# Patient Record
Sex: Female | Born: 1980 | Hispanic: No | Marital: Married | Smoking: Never smoker
Health system: Southern US, Community
[De-identification: ages and names within clinical notes are randomized; demographics above are authoritative.]

## PROBLEM LIST (undated history)

## (undated) ENCOUNTER — Inpatient Hospital Stay (HOSPITAL_COMMUNITY): Payer: Self-pay

## (undated) DIAGNOSIS — E039 Hypothyroidism, unspecified: Secondary | ICD-10-CM

## (undated) DIAGNOSIS — T148XXA Other injury of unspecified body region, initial encounter: Secondary | ICD-10-CM

## (undated) HISTORY — PX: NO PAST SURGERIES: SHX2092

---

## 2010-09-21 NOTE — L&D Delivery Note (Signed)
Patient was C/C/+3 and pushed for 60 minutes with epidural.   NSVD  female infant, Apgars 9,9, weight P.   The patient had no lacerations. Fundus was firm. EBL was expected. Placenta was delivered intact. Vagina was clear.  Baby was vigorous to bedside.  Christine Payne A

## 2011-02-18 LAB — ABO/RH: RH Type: POSITIVE

## 2011-02-18 LAB — RUBELLA ANTIBODY, IGM: Rubella: IMMUNE

## 2011-02-18 LAB — ANTIBODY SCREEN: Antibody Screen: NEGATIVE

## 2011-08-27 ENCOUNTER — Encounter (HOSPITAL_COMMUNITY): Payer: Self-pay | Admitting: *Deleted

## 2011-08-27 ENCOUNTER — Inpatient Hospital Stay (HOSPITAL_COMMUNITY): Payer: BC Managed Care – PPO | Admitting: Anesthesiology

## 2011-08-27 ENCOUNTER — Inpatient Hospital Stay (HOSPITAL_COMMUNITY)
Admission: AD | Admit: 2011-08-27 | Discharge: 2011-08-29 | DRG: 373 | Disposition: A | Discharge status: Home / Self Care | Payer: BC Managed Care – PPO | Source: Ambulatory Visit | Attending: Obstetrics and Gynecology | Admitting: Obstetrics and Gynecology

## 2011-08-27 ENCOUNTER — Encounter (HOSPITAL_COMMUNITY): Payer: Self-pay | Admitting: Anesthesiology

## 2011-08-27 DIAGNOSIS — E039 Hypothyroidism, unspecified: Secondary | ICD-10-CM | POA: Diagnosis present

## 2011-08-27 DIAGNOSIS — O99284 Endocrine, nutritional and metabolic diseases complicating childbirth: Principal | ICD-10-CM | POA: Diagnosis present

## 2011-08-27 DIAGNOSIS — E079 Disorder of thyroid, unspecified: Principal | ICD-10-CM | POA: Diagnosis present

## 2011-08-27 HISTORY — DX: Hypothyroidism, unspecified: E03.9

## 2011-08-27 LAB — CBC
MCH: 24 pg — ABNORMAL LOW (ref 26.0–34.0)
MCHC: 32.2 g/dL (ref 30.0–36.0)
MCV: 74.4 fL — ABNORMAL LOW (ref 78.0–100.0)
Platelets: 290 10*3/uL (ref 150–400)
RBC: 4.38 MIL/uL (ref 3.87–5.11)

## 2011-08-27 MED ORDER — MEASLES, MUMPS & RUBELLA VAC ~~LOC~~ INJ
0.5000 mL | INJECTION | Freq: Once | SUBCUTANEOUS | Status: DC
  Filled 2011-08-27: qty 0.5

## 2011-08-27 MED ORDER — FENTANYL 2.5 MCG/ML BUPIVACAINE 1/10 % EPIDURAL INFUSION (WH - ANES)
14.0000 mL/h | INTRAMUSCULAR | Status: DC
  Filled 2011-08-27: qty 60

## 2011-08-27 MED ORDER — PRENATAL PLUS 27-1 MG PO TABS
1.0000 | ORAL_TABLET | Freq: Every day | ORAL | Status: DC
  Administered 2011-08-28: 1 via ORAL
  Filled 2011-08-27: qty 1

## 2011-08-27 MED ORDER — DIPHENHYDRAMINE HCL 25 MG PO CAPS: 25.0000 mg | ORAL_CAPSULE | Freq: Four times a day (QID) | ORAL | Status: DC | PRN

## 2011-08-27 MED ORDER — OXYCODONE-ACETAMINOPHEN 5-325 MG PO TABS: 2.0000 | ORAL_TABLET | ORAL | Status: DC | PRN

## 2011-08-27 MED ORDER — BUTORPHANOL TARTRATE 2 MG/ML IJ SOLN: 1.0000 mg | INTRAMUSCULAR | Status: DC | PRN

## 2011-08-27 MED ORDER — FLEET ENEMA 7-19 GM/118ML RE ENEM: 1.0000 | ENEMA | RECTAL | Status: DC | PRN

## 2011-08-27 MED ORDER — OXYTOCIN BOLUS FROM INFUSION
500.0000 mL | Freq: Once | INTRAVENOUS | Status: AC
  Administered 2011-08-27: 500 mL via INTRAVENOUS
  Filled 2011-08-27: qty 1000
  Filled 2011-08-27: qty 500

## 2011-08-27 MED ORDER — SODIUM CHLORIDE 0.9 % IJ SOLN: 3.0000 mL | INTRAMUSCULAR | Status: DC | PRN

## 2011-08-27 MED ORDER — ONDANSETRON HCL 4 MG/2ML IJ SOLN: 4.0000 mg | INTRAMUSCULAR | Status: DC | PRN

## 2011-08-27 MED ORDER — BENZOCAINE-MENTHOL 20-0.5 % EX AERO
INHALATION_SPRAY | CUTANEOUS | Status: AC
  Administered 2011-08-27: 13:00:00
  Filled 2011-08-27: qty 56

## 2011-08-27 MED ORDER — SIMETHICONE 80 MG PO CHEW: 80.0000 mg | CHEWABLE_TABLET | ORAL | Status: DC | PRN

## 2011-08-27 MED ORDER — IBUPROFEN 800 MG PO TABS
800.0000 mg | ORAL_TABLET | Freq: Three times a day (TID) | ORAL | Status: DC
  Administered 2011-08-27 – 2011-08-29 (×7): 800 mg via ORAL
  Filled 2011-08-27 (×9): qty 1

## 2011-08-27 MED ORDER — SODIUM CHLORIDE 0.9 % IV SOLN: 250.0000 mL | INTRAVENOUS | Status: DC | PRN

## 2011-08-27 MED ORDER — OXYCODONE-ACETAMINOPHEN 5-325 MG PO TABS: 1.0000 | ORAL_TABLET | ORAL | Status: DC | PRN

## 2011-08-27 MED ORDER — ACETAMINOPHEN 325 MG PO TABS: 650.0000 mg | ORAL_TABLET | ORAL | Status: DC | PRN

## 2011-08-27 MED ORDER — MAGNESIUM HYDROXIDE 400 MG/5ML PO SUSP: 30.0000 mL | ORAL | Status: DC | PRN

## 2011-08-27 MED ORDER — LIDOCAINE HCL 1.5 % IJ SOLN
INTRAMUSCULAR | Status: DC | PRN
  Administered 2011-08-27: 3 mL via INTRADERMAL
  Administered 2011-08-27: 4 mL via EPIDURAL
  Administered 2011-08-27: 3 mL via EPIDURAL

## 2011-08-27 MED ORDER — SENNOSIDES-DOCUSATE SODIUM 8.6-50 MG PO TABS
2.0000 | ORAL_TABLET | Freq: Every day | ORAL | Status: DC
  Administered 2011-08-27 – 2011-08-28 (×2): 2 via ORAL

## 2011-08-27 MED ORDER — EPHEDRINE 5 MG/ML INJ: 10.0000 mg | INTRAVENOUS | Status: DC | PRN

## 2011-08-27 MED ORDER — LACTATED RINGERS IV SOLN: 500.0000 mL | INTRAVENOUS | Status: DC | PRN

## 2011-08-27 MED ORDER — CITRIC ACID-SODIUM CITRATE 334-500 MG/5ML PO SOLN: 30.0000 mL | ORAL | Status: DC | PRN

## 2011-08-27 MED ORDER — SODIUM CHLORIDE 0.9 % IJ SOLN: 3.0000 mL | Freq: Two times a day (BID) | INTRAMUSCULAR | Status: DC

## 2011-08-27 MED ORDER — FERROUS SULFATE 325 (65 FE) MG PO TABS
325.0000 mg | ORAL_TABLET | Freq: Two times a day (BID) | ORAL | Status: DC
  Administered 2011-08-28 – 2011-08-29 (×3): 325 mg via ORAL
  Filled 2011-08-27 (×3): qty 1

## 2011-08-27 MED ORDER — DIPHENHYDRAMINE HCL 50 MG/ML IJ SOLN: 12.5000 mg | INTRAMUSCULAR | Status: DC | PRN

## 2011-08-27 MED ORDER — OXYTOCIN 20 UNITS IN LACTATED RINGERS INFUSION - SIMPLE
125.0000 mL/h | Freq: Once | INTRAVENOUS | Status: AC
  Administered 2011-08-27: 125 mL/h via INTRAVENOUS

## 2011-08-27 MED ORDER — ONDANSETRON HCL 4 MG PO TABS: 4.0000 mg | ORAL_TABLET | ORAL | Status: DC | PRN

## 2011-08-27 MED ORDER — WITCH HAZEL-GLYCERIN EX PADS
1.0000 "application " | MEDICATED_PAD | CUTANEOUS | Status: DC | PRN
  Administered 2011-08-27: 1 via TOPICAL

## 2011-08-27 MED ORDER — NALBUPHINE SYRINGE 5 MG/0.5 ML: 5.0000 mg | INJECTION | INTRAMUSCULAR | Status: DC | PRN

## 2011-08-27 MED ORDER — TETANUS-DIPHTH-ACELL PERTUSSIS 5-2.5-18.5 LF-MCG/0.5 IM SUSP: 0.5000 mL | Freq: Once | INTRAMUSCULAR | Status: DC

## 2011-08-27 MED ORDER — PHENYLEPHRINE 40 MCG/ML (10ML) SYRINGE FOR IV PUSH (FOR BLOOD PRESSURE SUPPORT): 80.0000 ug | PREFILLED_SYRINGE | INTRAVENOUS | Status: DC | PRN

## 2011-08-27 MED ORDER — LIDOCAINE HCL (PF) 1 % IJ SOLN
30.0000 mL | INTRAMUSCULAR | Status: DC | PRN
  Filled 2011-08-27: qty 30

## 2011-08-27 MED ORDER — LACTATED RINGERS IV SOLN
500.0000 mL | Freq: Once | INTRAVENOUS | Status: AC
  Administered 2011-08-27: 09:00:00 via INTRAVENOUS

## 2011-08-27 MED ORDER — LACTATED RINGERS IV SOLN
INTRAVENOUS | Status: DC
  Administered 2011-08-27: 08:00:00 via INTRAVENOUS

## 2011-08-27 MED ORDER — IBUPROFEN 600 MG PO TABS: 600.0000 mg | ORAL_TABLET | Freq: Four times a day (QID) | ORAL | Status: DC | PRN

## 2011-08-27 MED ORDER — OXYTOCIN 20 UNITS IN LACTATED RINGERS INFUSION - SIMPLE: 125.0000 mL/h | INTRAVENOUS | Status: DC | PRN

## 2011-08-27 MED ORDER — ONDANSETRON HCL 4 MG/2ML IJ SOLN: 4.0000 mg | Freq: Four times a day (QID) | INTRAMUSCULAR | Status: DC | PRN

## 2011-08-27 MED ORDER — LANOLIN HYDROUS EX OINT: TOPICAL_OINTMENT | CUTANEOUS | Status: DC | PRN

## 2011-08-27 MED ORDER — ZOLPIDEM TARTRATE 5 MG PO TABS: 5.0000 mg | ORAL_TABLET | Freq: Every evening | ORAL | Status: DC | PRN

## 2011-08-27 MED ORDER — METHYLERGONOVINE MALEATE 0.2 MG/ML IJ SOLN: 0.2000 mg | INTRAMUSCULAR | Status: DC | PRN

## 2011-08-27 MED ORDER — BENZOCAINE-MENTHOL 20-0.5 % EX AERO: 1.0000 "application " | INHALATION_SPRAY | CUTANEOUS | Status: DC | PRN

## 2011-08-27 MED ORDER — DIBUCAINE 1 % RE OINT: 1.0000 "application " | TOPICAL_OINTMENT | RECTAL | Status: DC | PRN

## 2011-08-27 MED ORDER — METHYLERGONOVINE MALEATE 0.2 MG PO TABS: 0.2000 mg | ORAL_TABLET | ORAL | Status: DC | PRN

## 2011-08-27 NOTE — Anesthesia Postprocedure Evaluation (Signed)
  Anesthesia Post-op Note  Patient: Christine Payne  Procedure(s) Performed: * No procedures listed *  Patient Location: 143  Anesthesia Type: Epidural  Level of Consciousness: awake, alert  and oriented  Airway and Oxygen Therapy: Patient Spontanous Breathing  Post-op Pain: mild  Post-op Assessment: Post-op Vital signs reviewed, Patient's Cardiovascular Status Stable, No headache, No backache, No residual numbness and No residual motor weakness  Post-op Vital Signs: Reviewed and stable  Complications: No apparent anesthesia complications

## 2011-08-27 NOTE — Anesthesia Preprocedure Evaluation (Addendum)
Anesthesia Evaluation  Patient identified by MRN, date of birth, ID band Patient awake    Reviewed: Allergy & Precautions, H&P , Patient's Chart, lab work & pertinent test results  Airway Mallampati: III TM Distance: >3 FB Neck ROM: full    Dental No notable dental hx. (+) Teeth Intact   Pulmonary neg pulmonary ROS,  clear to auscultation  Pulmonary exam normal       Cardiovascular neg cardio ROS regular Normal    Neuro/Psych Negative Neurological ROS  Negative Psych ROS   GI/Hepatic negative GI ROS, Neg liver ROS,   Endo/Other  Negative Endocrine ROS  Renal/GU negative Renal ROS  Genitourinary negative   Musculoskeletal   Abdominal Normal abdominal exam  (+)   Peds  Hematology negative hematology ROS (+)   Anesthesia Other Findings   Reproductive/Obstetrics (+) Pregnancy                           Anesthesia Physical Anesthesia Plan  ASA: II  Anesthesia Plan: Epidural   Post-op Pain Management:    Induction:   Airway Management Planned:   Additional Equipment:   Intra-op Plan:   Post-operative Plan:   Informed Consent: I have reviewed the patients History and Physical, chart, labs and discussed the procedure including the risks, benefits and alternatives for the proposed anesthesia with the patient or authorized representative who has indicated his/her understanding and acceptance.     Plan Discussed with: Anesthesiologist and Surgeon  Anesthesia Plan Comments:         Anesthesia Quick Evaluation  

## 2011-08-27 NOTE — H&P (Signed)
30 y.o. [redacted]w[redacted]d  G1P0 comes in c/o labor.  Otherwise has good fetal movement and no bleeding.  Past Medical History  Diagnosis Date  . Hypothyroidism    No past surgical history on file.  OB History    Grav Para Term Preterm Abortions TAB SAB Ect Mult Living   1              # Outc Date GA Lbr Len/2nd Wgt Sex Del Anes PTL Lv   1 CUR               History   Social History  . Marital Status: Married    Spouse Name: N/A    Number of Children: N/A  . Years of Education: N/A   Occupational History  . Not on file.   Social History Main Topics  . Smoking status: Not on file  . Smokeless tobacco: Not on file  . Alcohol Use:   . Drug Use:   . Sexually Active:    Other Topics Concern  . Not on file   Social History Narrative  . No narrative on file   Review of patient's allergies indicates no known allergies.   Prenatal Course:  Uncomplicated.  Hx of hypothyroid but on no meds; TSH normal during pregnancy.  Filed Vitals:   08/27/11 0828  BP: 125/70  Pulse: 66  Temp: 98.4 F (36.9 C)  Resp: 20     Lungs/Cor:  NAD Abdomen:  soft, gravid Ex:  no cords, erythema SVE:  6/C/-2 per nurse FHTs:  140s, good STV, NST R Toco:  q3   A/P   Term labor.   GBS  Neg.  Zala Degrasse A

## 2011-08-27 NOTE — Progress Notes (Signed)
UR Chart review completed.  

## 2011-08-27 NOTE — Anesthesia Procedure Notes (Addendum)
Epidural Patient location during procedure: OB Start time: 08/27/2011 9:03 AM  Staffing Anesthesiologist: Jaine Estabrooks A. Performed by: anesthesiologist   Preanesthetic Checklist Completed: patient identified, site marked, surgical consent, pre-op evaluation, timeout performed, IV checked, risks and benefits discussed and monitors and equipment checked  Epidural Patient position: right lateral decubitus Prep: site prepped and draped and DuraPrep Patient monitoring: continuous pulse ox and blood pressure Approach: midline Injection technique: LOR air  Needle:  Needle type: Tuohy  Needle gauge: 17 G Needle length: 9 cm Needle insertion depth: 4 cm Catheter type: closed end flexible Catheter size: 19 Gauge Catheter at skin depth: 9 cm Test dose: negative and 1.5% lidocaine  Assessment Events: blood not aspirated, injection not painful, no injection resistance, negative IV test and no paresthesia  Additional Notes Patient is more comfortable after epidural dosed. Please see RN's note for documentation of vital signs and FHR which are stable.

## 2011-08-28 LAB — CBC
HCT: 27 % — ABNORMAL LOW (ref 36.0–46.0)
Hemoglobin: 8.6 g/dL — ABNORMAL LOW (ref 12.0–15.0)
MCH: 23.9 pg — ABNORMAL LOW (ref 26.0–34.0)
MCHC: 31.9 g/dL (ref 30.0–36.0)
MCV: 75 fL — ABNORMAL LOW (ref 78.0–100.0)
Platelets: 223 10*3/uL (ref 150–400)
RBC: 3.6 MIL/uL — ABNORMAL LOW (ref 3.87–5.11)
RDW: 17.4 % — ABNORMAL HIGH (ref 11.5–15.5)
WBC: 15.6 10*3/uL — ABNORMAL HIGH (ref 4.0–10.5)

## 2011-08-28 NOTE — Discharge Summary (Signed)
Obstetric Discharge Summary Reason for Admission: onset of labor Prenatal Procedures: NST and ultrasound Intrapartum Procedures: spontaneous vaginal delivery Postpartum Procedures: none Complications-Operative and Postpartum: none Hemoglobin  Date Value Range Status  08/28/2011 8.6* 12.0-15.0 (g/dL) Final     DELTA CHECK NOTED     REPEATED TO VERIFY     HCT  Date Value Range Status  08/28/2011 27.0* 36.0-46.0 (%) Final    Discharge Diagnoses: Term Pregnancy-delivered  Discharge Information: Date: 08/28/2011 Activity: pelvic rest Diet: routine Medications: Ibuprofen and Vicodin Condition: stable Instructions: refer to practice specific booklet Discharge to: home Follow-up Information    Follow up with HORVATH,MICHELLE A. (Routine postpartum visit)    Contact information:   [address] [place] [address] [phone]          Newborn Data: Live born female  Birth Weight: 5 lb 15.9 oz (2719 g) APGAR: 9, 9  Home with mother.  Rynlee Lisbon H. 08/28/2011, 9:16 AM

## 2011-08-28 NOTE — Progress Notes (Signed)
Post Partum Day 1  Subjective: no complaints, up ad lib, voiding, tolerating PO and + flatus  Objective: Blood pressure 101/68, pulse 84, temperature 98.6 F (37 C), temperature source Oral, resp. rate 18, height 5\' 5"  (1.651 m), weight 73.483 kg (162 lb), SpO2 99.00%, unknown if currently breastfeeding.  Physical Exam:  General: alert, cooperative, appears stated age and no distress Lochia: appropriate Uterine Fundus: firm DVT Evaluation: No evidence of DVT seen on physical exam.   Basename 08/28/11 0520 08/27/11 0820  HGB 8.6* 10.5*  HCT 27.0* 32.6*    Assessment/Plan: Routine postpartum Uncertain about circ.  Will discuss withhusband. If desires, will do prior to d/c tomorrow   LOS: 1 day   Nyana Haren H. 08/28/2011, 9:13 AM

## 2011-08-29 MED ORDER — IBUPROFEN 600 MG PO TABS: 600.0000 mg | ORAL_TABLET | Freq: Four times a day (QID) | ORAL | Status: AC | PRN

## 2011-08-29 MED ORDER — HYDROCODONE-ACETAMINOPHEN 5-500 MG PO TABS: 1.0000 | ORAL_TABLET | ORAL | Status: AC | PRN

## 2012-10-07 ENCOUNTER — Other Ambulatory Visit: Payer: Self-pay | Admitting: Otolaryngology

## 2012-10-07 DIAGNOSIS — H9319 Tinnitus, unspecified ear: Secondary | ICD-10-CM

## 2014-07-23 ENCOUNTER — Encounter (HOSPITAL_COMMUNITY): Payer: Self-pay | Admitting: *Deleted

## 2016-05-29 ENCOUNTER — Emergency Department (HOSPITAL_COMMUNITY)
Admission: EM | Admit: 2016-05-29 | Discharge: 2016-05-29 | Disposition: A | Discharge status: Home / Self Care | Payer: Medicaid Other | Attending: Emergency Medicine | Admitting: Emergency Medicine

## 2016-05-29 ENCOUNTER — Encounter (HOSPITAL_COMMUNITY): Payer: Self-pay | Admitting: Emergency Medicine

## 2016-05-29 ENCOUNTER — Emergency Department (HOSPITAL_COMMUNITY): Payer: Medicaid Other

## 2016-05-29 DIAGNOSIS — W11XXXA Fall on and from ladder, initial encounter: Secondary | ICD-10-CM | POA: Diagnosis not present

## 2016-05-29 DIAGNOSIS — E039 Hypothyroidism, unspecified: Secondary | ICD-10-CM | POA: Insufficient documentation

## 2016-05-29 DIAGNOSIS — Y939 Activity, unspecified: Secondary | ICD-10-CM | POA: Insufficient documentation

## 2016-05-29 DIAGNOSIS — Y999 Unspecified external cause status: Secondary | ICD-10-CM | POA: Diagnosis not present

## 2016-05-29 DIAGNOSIS — S8991XA Unspecified injury of right lower leg, initial encounter: Secondary | ICD-10-CM

## 2016-05-29 DIAGNOSIS — Y929 Unspecified place or not applicable: Secondary | ICD-10-CM | POA: Insufficient documentation

## 2016-05-29 HISTORY — DX: Other injury of unspecified body region, initial encounter: T14.8XXA

## 2016-05-29 MED ORDER — NAPROXEN 500 MG PO TABS: 500.0000 mg | ORAL_TABLET | Freq: Two times a day (BID) | ORAL | 0 refills | Status: DC

## 2016-05-29 MED ORDER — IBUPROFEN 400 MG PO TABS
800.0000 mg | ORAL_TABLET | Freq: Once | ORAL | Status: AC
  Administered 2016-05-29: 800 mg via ORAL
  Filled 2016-05-29: qty 2

## 2016-05-29 NOTE — Discharge Instructions (Signed)
There were no bony abnormalities on the x-ray. Weightbearing as tolerated. Use the knee sleeve and crutches as needed for comfort. Elevate the extremity whenever possible. Use naproxen or ibuprofen to reduce pain and inflammation.

## 2016-05-29 NOTE — ED Notes (Signed)
Wheeled pt back to room from waiting room. 

## 2016-05-29 NOTE — ED Provider Notes (Signed)
[facility]-EMERGENCY DEPT Provider Note   CSN: [id number] Arrival date & time: 05/29/16  [id number]  By signing my name below, I, [name], attest that this documentation has been prepared under the direction and in the presence of non-physician practitioner, Maude LericheScott Joy PA-C. Electronically Signed: [name], Scribe. 05/29/2016. 9:35 AM.   History   Chief Complaint Chief Complaint  Patient presents with  . Knee Pain   The history is provided by the patient. No language interpreter was used.    HPI Comments:  Christine PancoastJignasha Payne is a 35 y.o. female who presents to the Emergency Department complaining of sudden-onset constant right knee pain onset yesterday. Pt reports that she was on a three step ladder when she lost her balance and fell, landing with most of her weight on her right foot, but felt her right knee twist. Her pain is worsened by walking and palpation. No alleviating factors indicated. Denies neuro deficits, head injury, LOC, neck/back pain, or any other complaints.   Past Medical History:  Diagnosis Date  . Fracture    right leg  . Hypothyroidism     There are no active problems to display for this patient.   No past surgical history on file.  OB History    Gravida Para Term Preterm AB Living   1 1 1     1    SAB TAB Ectopic Multiple Live Births           1      Home Medications    Prior to Admission medications   Medication Sig Start Date End Date Taking? Authorizing Provider  naproxen (NAPROSYN) 500 MG tablet Take 1 tablet (500 mg total) by mouth 2 (two) times daily. 05/29/16   Anselm PancoastShawn C Joy, PA-C    Family History No family history on file.  Social History Social History  Substance Use Topics  . Smoking status: Not on file  . Smokeless tobacco: Not on file  . Alcohol use Not on file     Allergies   Review of patient's allergies indicates no known allergies.   Review of Systems Review of Systems  Musculoskeletal: Positive for arthralgias and myalgias.    Skin: Negative for wound.  Neurological: Negative for weakness and numbness.     Physical Exam Updated Vital Signs BP 124/78 (BP Location: Right Arm)   Pulse 65   Temp 97.8 F (36.6 C) (Oral)   Resp 16   Ht 5\' 5"  (1.651 m)   Wt 49 kg   LMP 05/17/2016 (Exact Date)   SpO2 100%   BMI 17.98 kg/m   Physical Exam  Constitutional: She appears well-developed and well-nourished. No distress.  HENT:  Head: Normocephalic and atraumatic.  Eyes: Conjunctivae are normal.  Neck: Neck supple.  Cardiovascular: Normal rate and regular rhythm.   Pulmonary/Chest: Effort normal.  Musculoskeletal: She exhibits edema and tenderness.  Swelling and tenderness to right knee on both lateral and medial side. Pain that worsens with valgus stress. Full extension but flexion limited to 90 degrees due to pain and stiffness. Patient is weightbearing with limping gait. No laxity, deformity, or noted effusion.  Neurological: She is alert.  No sensory deficits. Strength in right knee and ankle 5/5.   Skin: Skin is warm and dry. She is not diaphoretic.  Psychiatric: She has a normal mood and affect. Her behavior is normal.  Nursing note and vitals reviewed.    ED Treatments / Results  DIAGNOSTIC STUDIES:  Oxygen Saturation is 100% on RA, normal by  my interpretation.    COORDINATION OF CARE:  9:40 AM Discussed treatment plan with pt at bedside which included pain medication and imaging and pt agreed to plan.  Labs (all labs ordered are listed, but only abnormal results are displayed) Labs Reviewed - No data to display  EKG  EKG Interpretation None       Radiology Dg Knee Complete 4 Views Right  Result Date: 05/29/2016 CLINICAL DATA:  Larey Seat down yesterday down the ladder ,having lots of pain ant and post rt knee with some swilling EXAM: RIGHT KNEE - COMPLETE 4+ VIEW COMPARISON:  None. FINDINGS: No evidence of fracture, dislocation. Small effusion in the suprapatellar bursa. No evidence of  arthropathy or other focal bone abnormality. Soft tissues are unremarkable. IMPRESSION: 1. Small effusion without bone abnormality. Electronically Signed   By: Corlis Leak M.D.   On: 05/29/2016 10:16    Procedures Procedures (including critical care time)  Medications Ordered in ED Medications  ibuprofen (ADVIL,MOTRIN) tablet 800 mg (800 mg Oral Given 05/29/16 1017)     Initial Impression / Assessment and Plan / ED Course  I have reviewed the triage vital signs and the nursing notes.  Pertinent labs & imaging results that were available during my care of the patient were reviewed by me and considered in my medical decision making (see chart for details).  Clinical Course    Patient with right knee injury that occurred yesterday. No osseous abnormality on x-ray. Knee sleeve and crutches. Orthopedic follow-up. The patient was given instructions for home care as well as return precautions. Patient voices understanding of these instructions, accepts the plan, and is comfortable with discharge.     Final Clinical Impressions(s) / ED Diagnoses   Final diagnoses:  Knee injury, right, initial encounter    New Prescriptions New Prescriptions   NAPROXEN (NAPROSYN) 500 MG TABLET    Take 1 tablet (500 mg total) by mouth 2 (two) times daily.   I personally performed the services described in this documentation, which was scribed in my presence. The recorded information has been reviewed and is accurate.    Anselm Pancoast, PA-C 05/29/16 1035    Loren Racer, MD 06/03/16 0530

## 2016-05-29 NOTE — ED Notes (Addendum)
Applied knee sleeve to pt's right knee. Pt stated that it felt comfortable. Also gave pt crutches, pt ambulated with crutches and stated it felt comfortable.

## 2016-05-29 NOTE — ED Triage Notes (Signed)
Stumbled off step stool/ladder yesterday and twisted right knee, swollen, painful to walk.  Hx of old fx in same leg. Pt has rubbed turmeric rub on knee--

## 2016-12-15 DIAGNOSIS — K644 Residual hemorrhoidal skin tags: Secondary | ICD-10-CM | POA: Diagnosis not present

## 2017-08-31 IMAGING — DX DG KNEE COMPLETE 4+V*R*
4 series · 4 of 4 positions shown · non-contrast
Comparison: None.

CLINICAL DATA: Fell down yesterday down the ladder ,having lots of
pain ant and post rt knee with some swilling

EXAM:
RIGHT KNEE - COMPLETE 4+ VIEW

[t knee ap right]
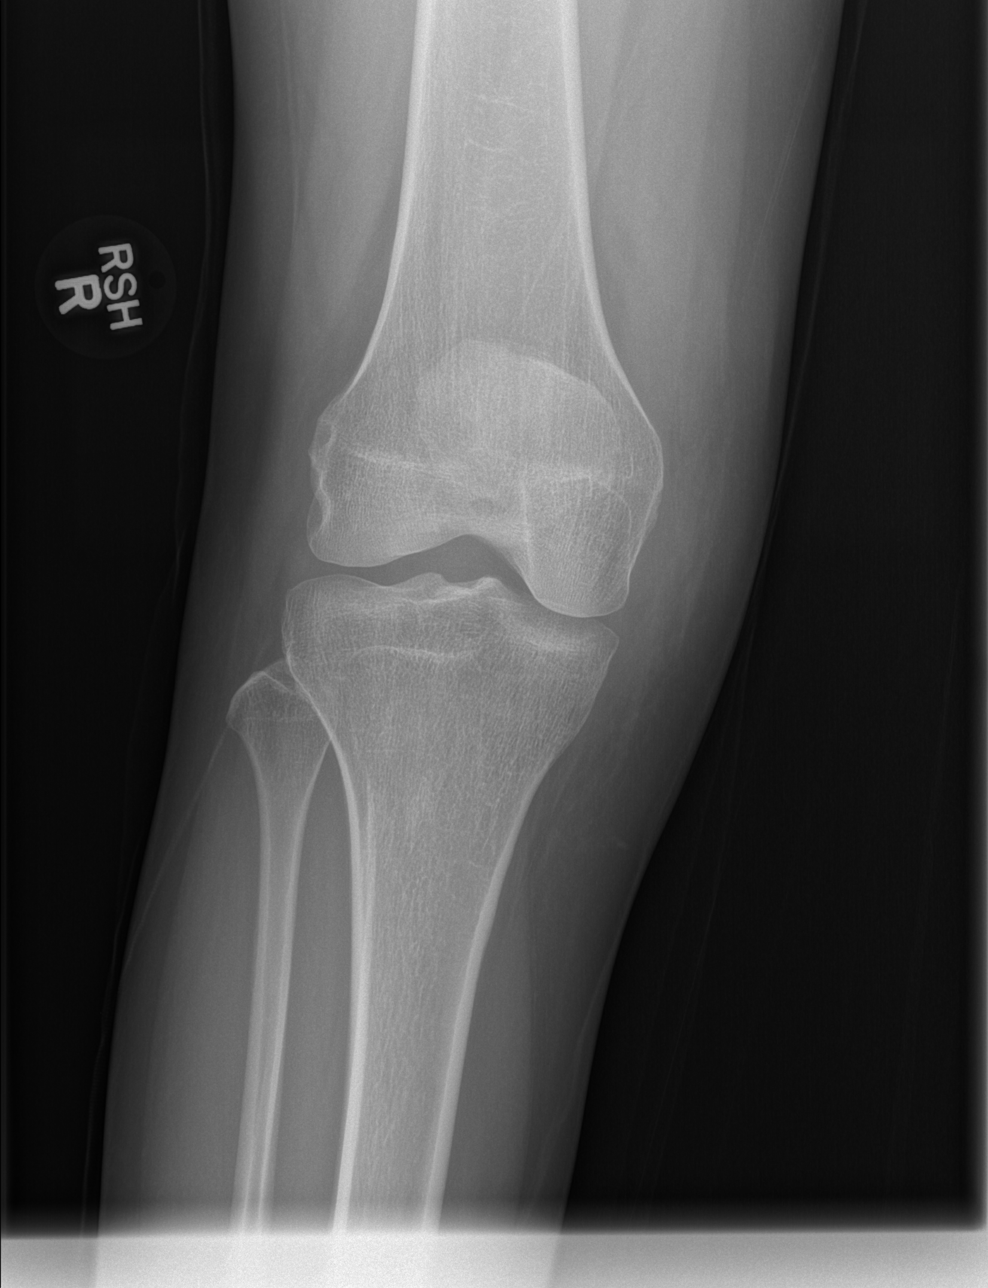

[t knee obl right]
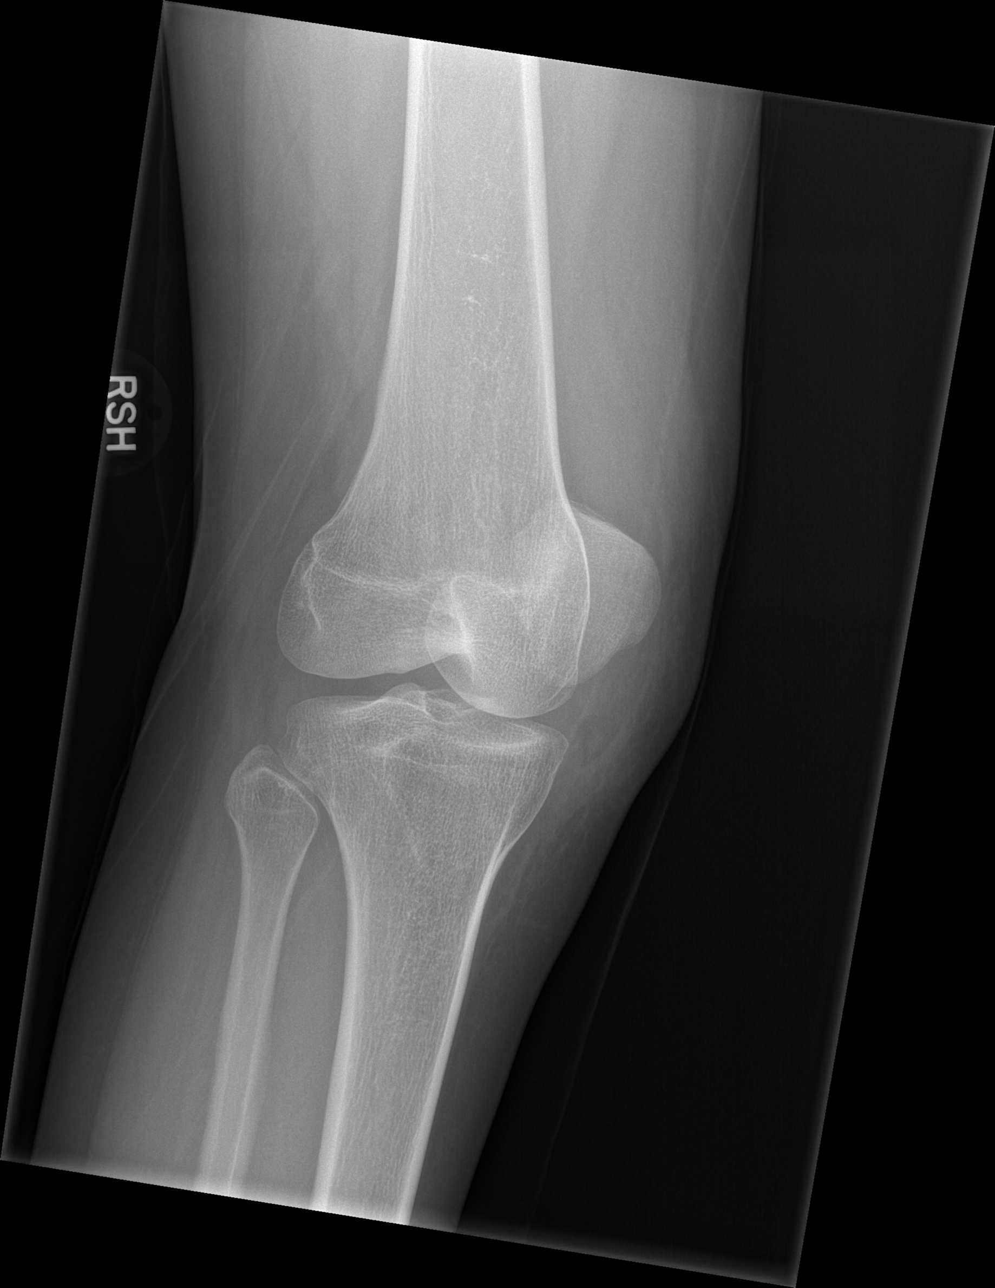

[t knee lat right (1 of 2)]
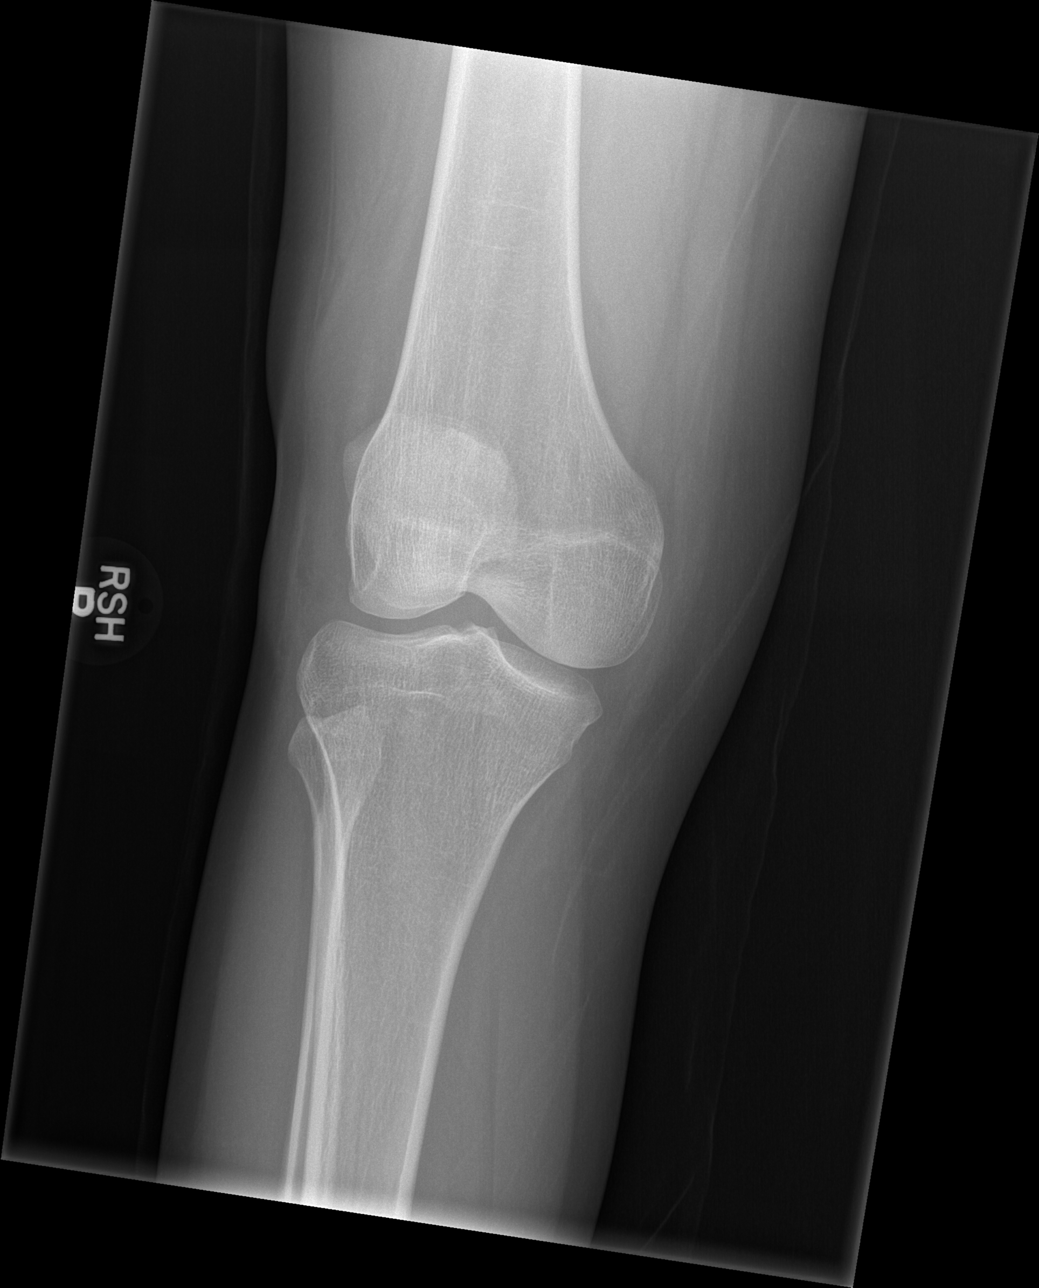

[t knee lat right (2 of 2)]
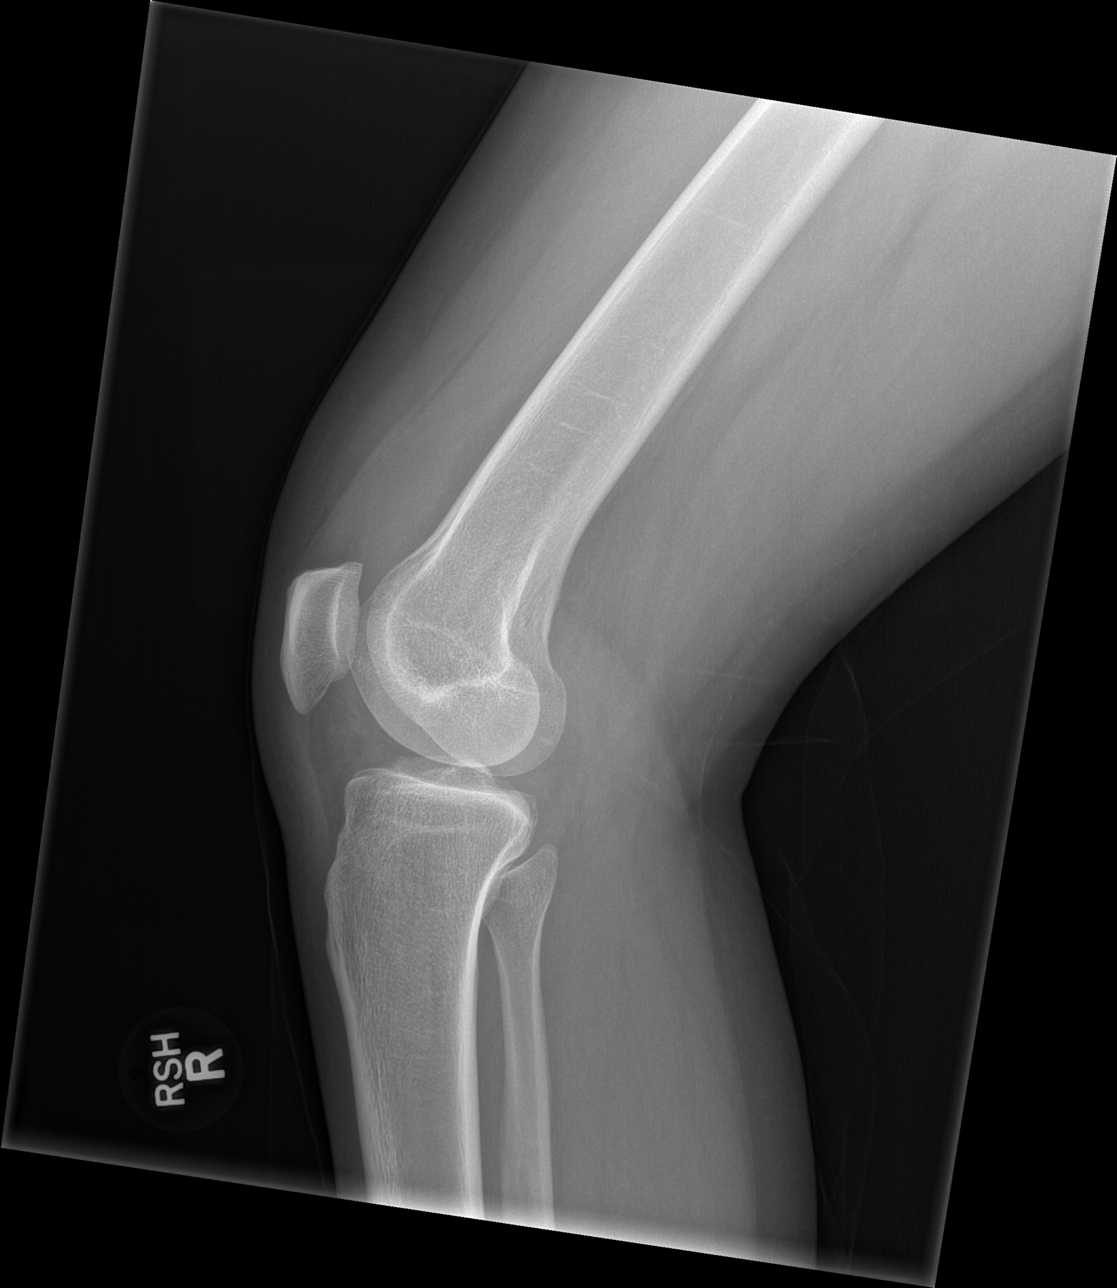

[4 of 4 positions shown; findings below may reference images not displayed]

FINDINGS: No evidence of fracture, dislocation. Small effusion in the
suprapatellar bursa. No evidence of arthropathy or other focal bone
abnormality. Soft tissues are unremarkable.
IMPRESSION: 1. Small effusion without bone abnormality.

## 2017-09-21 NOTE — L&D Delivery Note (Signed)
Patient delivered in car as arriving to hospital.  See MAU note   NSVD female infant, Apgars not noted, per staff double nuchal reduced, weight pending.   The patient had no laceration requiring repair, right periurethral abrasion. Fundus was firm. EBL once on L&D - none, see MAU note for any other delivery details. Placenta was delivered intact. Vagina was clear.  Baby was vigorous and doing skin to skin with mother.  Philip AspenALLAHAN, Christine Payne

## 2017-09-29 ENCOUNTER — Other Ambulatory Visit: Payer: Self-pay

## 2017-09-29 ENCOUNTER — Encounter (HOSPITAL_COMMUNITY): Payer: Self-pay | Admitting: *Deleted

## 2017-09-29 ENCOUNTER — Inpatient Hospital Stay (HOSPITAL_COMMUNITY)
Admission: AD | Admit: 2017-09-29 | Discharge: 2017-09-29 | Disposition: A | Discharge status: Home / Self Care | Payer: Self-pay | Source: Ambulatory Visit | Attending: Obstetrics and Gynecology | Admitting: Obstetrics and Gynecology

## 2017-09-29 DIAGNOSIS — Z3A09 9 weeks gestation of pregnancy: Secondary | ICD-10-CM | POA: Insufficient documentation

## 2017-09-29 DIAGNOSIS — O26891 Other specified pregnancy related conditions, first trimester: Secondary | ICD-10-CM | POA: Insufficient documentation

## 2017-09-29 DIAGNOSIS — E039 Hypothyroidism, unspecified: Secondary | ICD-10-CM | POA: Insufficient documentation

## 2017-09-29 DIAGNOSIS — O210 Mild hyperemesis gravidarum: Secondary | ICD-10-CM | POA: Insufficient documentation

## 2017-09-29 DIAGNOSIS — K219 Gastro-esophageal reflux disease without esophagitis: Secondary | ICD-10-CM | POA: Insufficient documentation

## 2017-09-29 DIAGNOSIS — Z791 Long term (current) use of non-steroidal anti-inflammatories (NSAID): Secondary | ICD-10-CM | POA: Insufficient documentation

## 2017-09-29 DIAGNOSIS — O99611 Diseases of the digestive system complicating pregnancy, first trimester: Secondary | ICD-10-CM | POA: Insufficient documentation

## 2017-09-29 DIAGNOSIS — R1010 Upper abdominal pain, unspecified: Secondary | ICD-10-CM | POA: Insufficient documentation

## 2017-09-29 DIAGNOSIS — E860 Dehydration: Secondary | ICD-10-CM | POA: Insufficient documentation

## 2017-09-29 DIAGNOSIS — O99281 Endocrine, nutritional and metabolic diseases complicating pregnancy, first trimester: Secondary | ICD-10-CM | POA: Insufficient documentation

## 2017-09-29 LAB — URINALYSIS, ROUTINE W REFLEX MICROSCOPIC
BILIRUBIN URINE: NEGATIVE
Glucose, UA: NEGATIVE mg/dL
Hgb urine dipstick: NEGATIVE
KETONES UR: 80 mg/dL — AB
LEUKOCYTES UA: NEGATIVE
Nitrite: NEGATIVE
Protein, ur: 30 mg/dL — AB
SPECIFIC GRAVITY, URINE: 1.029 (ref 1.005–1.030)
pH: 5 (ref 5.0–8.0)

## 2017-09-29 LAB — CBC WITH DIFFERENTIAL/PLATELET
BASOS ABS: 0 10*3/uL (ref 0.0–0.1)
Basophils Relative: 0 %
EOS PCT: 1 %
Eosinophils Absolute: 0.1 10*3/uL (ref 0.0–0.7)
HCT: 38.9 % (ref 36.0–46.0)
Hemoglobin: 13.6 g/dL (ref 12.0–15.0)
Lymphocytes Relative: 17 %
Lymphs Abs: 1.8 10*3/uL (ref 0.7–4.0)
MCH: 28.1 pg (ref 26.0–34.0)
MCHC: 35 g/dL (ref 30.0–36.0)
MCV: 80.4 fL (ref 78.0–100.0)
Monocytes Absolute: 0.2 10*3/uL (ref 0.1–1.0)
Monocytes Relative: 2 %
Neutro Abs: 8.4 10*3/uL — ABNORMAL HIGH (ref 1.7–7.7)
Neutrophils Relative %: 80 %
PLATELETS: 231 10*3/uL (ref 150–400)
RBC: 4.84 MIL/uL (ref 3.87–5.11)
RDW: 13.2 % (ref 11.5–15.5)
WBC: 10.6 10*3/uL — AB (ref 4.0–10.5)

## 2017-09-29 LAB — COMPREHENSIVE METABOLIC PANEL
ALT: 13 U/L — AB (ref 14–54)
AST: 17 U/L (ref 15–41)
Albumin: 4.4 g/dL (ref 3.5–5.0)
Alkaline Phosphatase: 60 U/L (ref 38–126)
Anion gap: 12 (ref 5–15)
BUN: 19 mg/dL (ref 6–20)
CHLORIDE: 100 mmol/L — AB (ref 101–111)
CO2: 21 mmol/L — AB (ref 22–32)
CREATININE: 0.59 mg/dL (ref 0.44–1.00)
Calcium: 9 mg/dL (ref 8.9–10.3)
GFR calc Af Amer: >60 mL/min (ref >60)
GFR calc non Af Amer: >60 mL/min (ref >60)
Glucose, Bld: 87 mg/dL (ref 65–99)
Potassium: 3.8 mmol/L (ref 3.5–5.1)
SODIUM: 133 mmol/L — AB (ref 135–145)
Total Bilirubin: 0.8 mg/dL (ref 0.3–1.2)
Total Protein: 7.6 g/dL (ref 6.5–8.1)

## 2017-09-29 LAB — POCT PREGNANCY, URINE: PREG TEST UR: POSITIVE — AB

## 2017-09-29 MED ORDER — ONDANSETRON 8 MG PO TBDP: 8.0000 mg | ORAL_TABLET | Freq: Three times a day (TID) | ORAL | 0 refills | Status: DC | PRN

## 2017-09-29 MED ORDER — PROMETHAZINE HCL 25 MG/ML IJ SOLN
25.0000 mg | Freq: Once | INTRAMUSCULAR | Status: AC
  Administered 2017-09-29: 25 mg via INTRAVENOUS
  Filled 2017-09-29: qty 1

## 2017-09-29 MED ORDER — DEXTROSE 5 % IN LACTATED RINGERS IV BOLUS
1000.0000 mL | Freq: Once | INTRAVENOUS | Status: AC
  Administered 2017-09-29: 1000 mL via INTRAVENOUS

## 2017-09-29 MED ORDER — SODIUM CHLORIDE 0.9 % IV SOLN
8.0000 mg | Freq: Once | INTRAVENOUS | Status: AC
  Administered 2017-09-29: 8 mg via INTRAVENOUS
  Filled 2017-09-29: qty 4

## 2017-09-29 MED ORDER — PROMETHAZINE HCL 25 MG RE SUPP: 25.0000 mg | Freq: Four times a day (QID) | RECTAL | 0 refills | Status: DC | PRN

## 2017-09-29 MED ORDER — RANITIDINE HCL 150 MG PO TABS: 150.0000 mg | ORAL_TABLET | Freq: Two times a day (BID) | ORAL | 1 refills | Status: DC

## 2017-09-29 MED ORDER — M.V.I. ADULT IV INJ
Freq: Once | INTRAVENOUS | Status: AC
  Administered 2017-09-29: 18:00:00 via INTRAVENOUS
  Filled 2017-09-29: qty 1000

## 2017-09-29 MED ORDER — FAMOTIDINE IN NACL 20-0.9 MG/50ML-% IV SOLN
20.0000 mg | Freq: Once | INTRAVENOUS | Status: AC
  Administered 2017-09-29: 20 mg via INTRAVENOUS
  Filled 2017-09-29: qty 50

## 2017-09-29 NOTE — Discharge Instructions (Signed)
Eating Plan for Hyperemesis Gravidarum °Hyperemesis gravidarum is a severe form of morning sickness. Because this condition causes severe nausea and vomiting, it can lead to dehydration, malnutrition, and weight loss. One way to lessen the symptoms of nausea and vomiting is to follow the eating plan for hyperemesis gravidarum. It is often used along with prescribed medicines to control your symptoms. °What can I do to relieve my symptoms? °Listen to your body. Everyone is different and has different preferences. Find what works best for you. Take any of the following actions that are helpful to you: °· Eat and drink slowly. °· Eat 5-6 small meals daily instead of 3 large meals. °· Eat crackers before you get out of bed in the morning. °· Try having a snack in the middle of the night. °· Starchy foods are usually tolerated well. Examples include cereal, toast, bread, potatoes, pasta, rice, and pretzels. °· Ginger may help with nausea. Add ¼ tsp ground ginger to hot tea or choose ginger tea. °· Try drinking 100% fruit juice or an electrolyte drink. An electrolyte drink contains sodium, potassium, and chloride. °· Continue to take your prenatal vitamins as told by your health care provider. If you are having trouble taking your prenatal vitamins, talk with your health care provider about different options. °· Include at least 1 serving of protein with your meals and snacks. Protein options include meats or poultry, beans, nuts, eggs, and yogurt. Try eating a protein-rich snack before bed. Examples of these snacks include cheese and crackers or half of a peanut butter or turkey sandwich. °· Consider eliminating foods that trigger your symptoms. These may include spicy foods, coffee, high-fat foods, very sweet foods, and acidic foods. °· Try meals that have more protein combined with bland, salty, lower-fat, and dry foods, such as nuts, seeds, pretzels, crackers, and cereal. °· Talk with your healthcare provider about  starting a supplement of vitamin B6. °· Have fluids that are cold, clear, and carbonated or sour. Examples include lemonade, ginger ale, lemon-lime soda, ice water, and sparkling water. °· Try lemon or mint tea. °· Try brushing your teeth or using a mouth rinse after meals. ° °What should I avoid to reduce my symptoms? °Avoiding some of the following things may help reduce your symptoms. °· Foods with strong smells. Try eating meals in well-ventilated areas that are free of odors. °· Drinking water or other beverages with meals. Try not to drink anything during the 30 minutes before and after your meals. °· Drinking more than 1 cup of fluid at a time. Sometimes using a straw helps. °· Fried or high-fat foods, such as butter and cream sauces. °· Spicy foods. °· Skipping meals as best as you can. Nausea can be more intense on an empty stomach. If you cannot tolerate food at that time, do not force it. Try sucking on ice chips or other frozen items, and make up for missed calories later. °· Lying down within 2 hours after eating. °· Environmental triggers. These may include smoky rooms, closed spaces, rooms with strong smells, warm or humid places, overly loud and noisy rooms, and rooms with motion or flickering lights. °· Quick and sudden changes in your movement. ° °This information is not intended to replace advice given to you by your health care provider. Make sure you discuss any questions you have with your health care provider. °Document Released: 07/05/2007 Document Revised: 05/06/2016 Document Reviewed: 04/07/2016 °Elsevier Interactive Patient Education © 2018 [employer] ° °

## 2017-09-29 NOTE — MAU Note (Signed)
Has not been seen yet with preg, has appt at end of the month.  Unable to keep any food or fluids down, ongoing problem for 10 days now.  Only pain is when she throws up.

## 2017-09-29 NOTE — MAU Provider Note (Signed)
History     CSN: [id number]  Arrival date and time: 09/29/17 1559   First Provider Initiated Contact with Patient 09/29/17 1702      Chief Complaint  Patient presents with  . Emesis  . Possible Pregnancy   HPI   Ms.Christine Payne is a 37 y.o. female G70P1001 @ Unknown here in MAU with N.V. States she has not been able to keep anything down for 10 days. Patient is present here with her significant other and son. Patient is actively vomiting in MAU. Patient is having some upper abdominal pain that she describes as a burning in her chest that radiates up to her throat. She has not taken any medications for the N.V. She is scheduled to see [facility] next week.   OB History    Gravida Para Term Preterm AB Living   _0 SAB TAB Ectopic Multiple Live Births           1      Past Medical History:  Diagnosis Date  . Fracture    right leg  . Hypothyroidism     No past surgical history on file.  No family history on file.  Social History   Tobacco Use  . Smoking status: Not on file  Substance Use Topics  . Alcohol use: Not on file  . Drug use: Not on file    Allergies: No Known Allergies  Medications Prior to Admission  Medication Sig Dispense Refill Last Dose  . naproxen (NAPROSYN) 500 MG tablet Take 1 tablet (500 mg total) by mouth 2 (two) times daily. 30 tablet 0    Results for orders placed or performed during the hospital encounter of 09/29/17 (from the past 48 hour(s))  Urinalysis, Routine w reflex microscopic     Status: Abnormal   Collection Time: 09/29/17  4:15 PM  Result Value Ref Range   Color, Urine YELLOW YELLOW   APPearance HAZY (A) CLEAR   Specific Gravity, Urine 1.029 1.005 - 1.030   pH 5.0 5.0 - 8.0   Glucose, UA NEGATIVE NEGATIVE mg/dL   Hgb urine dipstick NEGATIVE NEGATIVE   Bilirubin Urine NEGATIVE NEGATIVE   Ketones, ur 80 (A) NEGATIVE mg/dL   Protein, ur 30 (A) NEGATIVE mg/dL   Nitrite NEGATIVE NEGATIVE   Leukocytes, UA  NEGATIVE NEGATIVE   RBC / HPF 0-5 0 - 5 RBC/hpf   WBC, UA 6-30 0 - 5 WBC/hpf   Bacteria, UA RARE (A) NONE SEEN   Squamous Epithelial / LPF 0-5 (A) NONE SEEN   Mucus PRESENT   Pregnancy, urine POC     Status: Abnormal   Collection Time: 09/29/17  4:26 PM  Result Value Ref Range   Preg Test, Ur POSITIVE (A) NEGATIVE    Comment:        THE SENSITIVITY OF THIS METHODOLOGY IS >24 mIU/mL   CBC with Differential     Status: Abnormal   Collection Time: 09/29/17  5:36 PM  Result Value Ref Range   WBC 10.6 (H) 4.0 - 10.5 K/uL   RBC 4.84 3.87 - 5.11 MIL/uL   Hemoglobin 13.6 12.0 - 15.0 g/dL   HCT 38.9 36.0 - 46.0 %   MCV 80.4 78.0 - 100.0 fL   MCH 28.1 26.0 - 34.0 pg   MCHC 35.0 30.0 - 36.0 g/dL   RDW 13.2 11.5 - 15.5 %   Platelets 231 150 - 400 K/uL   Neutrophils Relative %  80 %   Neutro Abs 8.4 (H) 1.7 - 7.7 K/uL   Lymphocytes Relative 17 %   Lymphs Abs 1.8 0.7 - 4.0 K/uL   Monocytes Relative 2 %   Monocytes Absolute 0.2 0.1 - 1.0 K/uL   Eosinophils Relative 1 %   Eosinophils Absolute 0.1 0.0 - 0.7 K/uL   Basophils Relative 0 %   Basophils Absolute 0.0 0.0 - 0.1 K/uL  Comprehensive metabolic panel     Status: Abnormal   Collection Time: 09/29/17  5:36 PM  Result Value Ref Range   Sodium 133 (L) 135 - 145 mmol/L   Potassium 3.8 3.5 - 5.1 mmol/L   Chloride 100 (L) 101 - 111 mmol/L   CO2 21 (L) 22 - 32 mmol/L   Glucose, Bld 87 65 - 99 mg/dL   BUN 19 6 - 20 mg/dL   Creatinine, Ser 0.59 0.44 - 1.00 mg/dL   Calcium 9.0 8.9 - 10.3 mg/dL   Total Protein 7.6 6.5 - 8.1 g/dL   Albumin 4.4 3.5 - 5.0 g/dL   AST 17 15 - 41 U/L   ALT 13 (L) 14 - 54 U/L   Alkaline Phosphatase 60 38 - 126 U/L   Total Bilirubin 0.8 0.3 - 1.2 mg/dL   GFR calc non Af Amer >60 >60 mL/min   GFR calc Af Amer >60 >60 mL/min    Comment: (NOTE) The eGFR has been calculated using the CKD EPI equation. This calculation has not been validated in all clinical situations. eGFR's persistently <60 mL/min signify  possible Chronic Kidney Disease.    Anion gap 12 5 - 15   Review of Systems  Constitutional: Negative for fever.  Gastrointestinal: Positive for nausea and vomiting. Negative for abdominal pain.       + GERD   Genitourinary: Negative for vaginal bleeding.   Physical Exam   Blood pressure 101/63, pulse 67, temperature 98.1 F (36.7 C), temperature source Oral, resp. rate 16, weight 125 lb (56.7 kg), unknown if currently breastfeeding.  Physical Exam  Constitutional: She is oriented to person, place, and time. Vital signs are normal. She appears well-developed and well-nourished. She is sleeping.  Non-toxic appearance. She has a sickly appearance. She appears ill. No distress.  HENT:  Head: Normocephalic.  Eyes: Pupils are equal, round, and reactive to light.  Cardiovascular: Normal rate.  Respiratory: Effort normal and breath sounds normal.  GI: Soft. She exhibits no distension. There is no tenderness. There is no rebound and no guarding.  Musculoskeletal: Normal range of motion.  Neurological: She is alert and oriented to person, place, and time.  Skin: Skin is warm. She is not diaphoretic.  Psychiatric: Her behavior is normal.   MAU Course  Procedures  None  MDM  UA shows severe dehydration  D5LR bolus X 1 MVI bolus X 1 Phenergan 25 mg IV X 1 Pepcid 20 mg IV X 1 Patient continues to vomit in MAU: discussed offering Zofran, patient agreeable. 8 mg Zofran IVPB given X 1 Patient tolerating PO fluids and crackers. Ready to go home.  Discussed patient with Dr. Rogue Bussing who is agreeable with the plan of care: ok for DC home.  Assessment and Plan   A:  1. Hyperemesis arising during pregnancy   2. Gastroesophageal reflux disease, esophagitis presence not specified   3. Dehydration, severe     P:  Discharge home with strict return precautions Rx: Zantac, Phenergan supp, Zofran ODT Once tolerating fluids and food, start vitamin b6 and unisom  OTC Return to MAU if  symptoms worsen Follow up with OB as scheduled Small, frequent meals    Rasch, Artist Pais, NP 09/30/2017 8:39 AM

## 2017-09-29 NOTE — Progress Notes (Addendum)
G2P1 @ 8.[redacted] wksga per LMP. Pt here dt c/o n/v and unable to take in anything PO. Denies pain and bleeding.   Provider at bs. Ordered for IV and meds.   Unable to get IV. Another nurse called to assist after two attempts. IV on left AC infusing D5LR bolus per order.   pepcid and phenergan given per order  1829: Multivitamin up and bolus infusing. New order for zofran IVPB. Pending via tube system.   zofran IVPB up  1930: Assisted pt up to bathroom. Voided without problems. IV with Multivitamin cont to bolus.   IV d/c'd per provider.   D/C instructions given with pt understanding. Pt left unit via ambulatory with SO

## 2018-04-15 DIAGNOSIS — Z3480 Encounter for supervision of other normal pregnancy, unspecified trimester: Secondary | ICD-10-CM | POA: Diagnosis not present

## 2018-04-26 ENCOUNTER — Other Ambulatory Visit: Payer: Self-pay | Admitting: Obstetrics and Gynecology

## 2018-05-02 ENCOUNTER — Other Ambulatory Visit: Payer: Self-pay | Admitting: Obstetrics and Gynecology

## 2018-05-04 ENCOUNTER — Inpatient Hospital Stay (HOSPITAL_COMMUNITY)
Admission: AD | Admit: 2018-05-04 | Discharge: 2018-05-06 | DRG: 776 | Disposition: A | Discharge status: Home / Self Care | Payer: Medicaid Other | Attending: Obstetrics and Gynecology | Admitting: Obstetrics and Gynecology

## 2018-05-04 ENCOUNTER — Other Ambulatory Visit: Payer: Self-pay

## 2018-05-04 ENCOUNTER — Encounter (HOSPITAL_COMMUNITY): Payer: Self-pay | Admitting: *Deleted

## 2018-05-04 DIAGNOSIS — O09529 Supervision of elderly multigravida, unspecified trimester: Secondary | ICD-10-CM

## 2018-05-04 LAB — CBC
HEMATOCRIT: 28.2 % — AB (ref 36.0–46.0)
Hemoglobin: 9 g/dL — ABNORMAL LOW (ref 12.0–15.0)
MCH: 23.6 pg — ABNORMAL LOW (ref 26.0–34.0)
MCHC: 31.9 g/dL (ref 30.0–36.0)
MCV: 73.8 fL — AB (ref 78.0–100.0)
Platelets: 227 10*3/uL (ref 150–400)
RBC: 3.82 MIL/uL — AB (ref 3.87–5.11)
RDW: 16 % — ABNORMAL HIGH (ref 11.5–15.5)
WBC: 16.1 10*3/uL — AB (ref 4.0–10.5)

## 2018-05-04 MED ORDER — IBUPROFEN 600 MG PO TABS
600.0000 mg | ORAL_TABLET | Freq: Four times a day (QID) | ORAL | Status: DC
  Administered 2018-05-05 – 2018-05-06 (×7): 600 mg via ORAL
  Filled 2018-05-04 (×7): qty 1

## 2018-05-04 MED ORDER — SOD CITRATE-CITRIC ACID 500-334 MG/5ML PO SOLN: 30.0000 mL | ORAL | Status: DC | PRN

## 2018-05-04 MED ORDER — OXYTOCIN 40 UNITS IN LACTATED RINGERS INFUSION - SIMPLE MED
INTRAVENOUS | Status: AC
  Filled 2018-05-04: qty 1000

## 2018-05-04 MED ORDER — MISOPROSTOL 25 MCG QUARTER TABLET
25.0000 ug | ORAL_TABLET | ORAL | Status: DC | PRN
  Filled 2018-05-04: qty 1

## 2018-05-04 MED ORDER — LIDOCAINE HCL (PF) 1 % IJ SOLN
30.0000 mL | INTRAMUSCULAR | Status: DC | PRN
  Filled 2018-05-04: qty 30

## 2018-05-04 MED ORDER — OXYCODONE-ACETAMINOPHEN 5-325 MG PO TABS
1.0000 | ORAL_TABLET | ORAL | Status: DC | PRN
  Administered 2018-05-05: 1 via ORAL
  Filled 2018-05-04: qty 1

## 2018-05-04 MED ORDER — LACTATED RINGERS IV SOLN: 500.0000 mL | INTRAVENOUS | Status: DC | PRN

## 2018-05-04 MED ORDER — ONDANSETRON HCL 4 MG/2ML IJ SOLN: 4.0000 mg | Freq: Four times a day (QID) | INTRAMUSCULAR | Status: DC | PRN

## 2018-05-04 MED ORDER — LACTATED RINGERS IV SOLN: INTRAVENOUS | Status: DC

## 2018-05-04 MED ORDER — TERBUTALINE SULFATE 1 MG/ML IJ SOLN
0.2500 mg | Freq: Once | INTRAMUSCULAR | Status: DC | PRN
  Filled 2018-05-04: qty 1

## 2018-05-04 MED ORDER — ACETAMINOPHEN 325 MG PO TABS: 650.0000 mg | ORAL_TABLET | ORAL | Status: DC | PRN

## 2018-05-04 MED ORDER — OXYTOCIN BOLUS FROM INFUSION
500.0000 mL | Freq: Once | INTRAVENOUS | Status: AC
  Administered 2018-05-04: 500 mL via INTRAVENOUS

## 2018-05-04 MED ORDER — OXYTOCIN 40 UNITS IN LACTATED RINGERS INFUSION - SIMPLE MED: 2.5000 [IU]/h | INTRAVENOUS | Status: DC

## 2018-05-04 NOTE — H&P (Signed)
37 y.o. 6520w2d  G2P2002 [facility] baby boy [facility] car, per staff, double nuchal reduced and baby doing well.  No significant bleeding reported and pt transferred to L&D.    Past Medical History:  Diagnosis Date  . Fracture    right leg  . Hypothyroidism     Past Surgical History:  Procedure Laterality Date  . NO PAST SURGERIES      OB History  Gravida Para Term Preterm AB Living  2 1 1     1   SAB TAB Ectopic Multiple Live Births          1    # Outcome Date GA Lbr Len/2nd Weight Sex Delivery Anes PTL Lv  2 Current           1 Term 08/27/11 1531w0d 04:52 / 01:21 2719 g M Vag-Spont EPI  LIV     Birth Comments: wnl    Social History   Socioeconomic History  . Marital status: Married    Spouse name: Not on file  . Number of children: Not on file  . Years of education: Not on file  . Highest education level: Not on file  Occupational History  . Not on file  Social Needs  . Financial resource strain: Not on file  . Food insecurity:    Worry: Not on file    Inability: Not on file  . Transportation needs:    Medical: Not on file    Non-medical: Not on file  Tobacco Use  . Smoking status: Never Smoker  . Smokeless tobacco: Never Used  Substance and Sexual Activity  . Alcohol use: No    Frequency: Never  . Drug use: No  . Sexual activity: Yes    Birth control/protection: None  Lifestyle  . Physical activity:    Days per week: Not on file    Minutes per session: Not on file  . Stress: Not on file  Relationships  . Social connections:    Talks on phone: Not on file    Gets together: Not on file    Attends religious service: Not on file    Active member of club or organization: Not on file    Attends meetings of clubs or organizations: Not on file    Relationship status: Not on file  . Intimate partner violence:    Fear of current or ex partner: Not on file    Emotionally abused: Not on file    Physically abused: Not on file    Forced sexual  activity: Not on file  Other Topics Concern  . Not on file  Social History Narrative  . Not on file   Patient has no known allergies.    Prenatal Transfer Tool  Maternal Diabetes: No Genetic Screening: Declined Maternal Ultrasounds/Referrals: Normal Fetal Ultrasounds or other Referrals:  None Maternal Substance Abuse:  No Significant Maternal Medications:  None Significant Maternal Lab Results: Lab values include: Group B Strep negative  Other PNC: AMA, anemia with Hb of 9.6 on 02/15/2018- pt prescribed Fe supplement but did not take    Vitals:   05/04/18 2255  BP: (!) 143/53  Pulse: 83  Resp: 18    Lungs/Cor:  NAD Abdomen:  soft, tender post delivery with fundal massage Ex:  no cords, erythema SVE: abrasion - see deliver ynote   A/P   Pt admitted to L&D s/p delivery in car  GBS Neg  AMA and anemia, CBC drawn and pending  Tikia Skilton,  Djuan Talton

## 2018-05-05 ENCOUNTER — Encounter (HOSPITAL_COMMUNITY): Payer: Self-pay | Admitting: *Deleted

## 2018-05-05 LAB — CBC
HEMATOCRIT: 25.2 % — AB (ref 36.0–46.0)
Hemoglobin: 8.4 g/dL — ABNORMAL LOW (ref 12.0–15.0)
MCH: 24.4 pg — AB (ref 26.0–34.0)
MCHC: 33.3 g/dL (ref 30.0–36.0)
MCV: 73.3 fL — AB (ref 78.0–100.0)
Platelets: 216 10*3/uL (ref 150–400)
RBC: 3.44 MIL/uL — AB (ref 3.87–5.11)
RDW: 16 % — ABNORMAL HIGH (ref 11.5–15.5)
WBC: 17.7 10*3/uL — AB (ref 4.0–10.5)

## 2018-05-05 LAB — ABO/RH: ABO/RH(D): B POS

## 2018-05-05 LAB — TYPE AND SCREEN
ABO/RH(D): B POS
Antibody Screen: NEGATIVE

## 2018-05-05 MED ORDER — WITCH HAZEL-GLYCERIN EX PADS
1.0000 "application " | MEDICATED_PAD | CUTANEOUS | Status: DC | PRN
  Administered 2018-05-05: 1 via TOPICAL

## 2018-05-05 MED ORDER — OXYCODONE-ACETAMINOPHEN 5-325 MG PO TABS: 2.0000 | ORAL_TABLET | ORAL | Status: DC | PRN

## 2018-05-05 MED ORDER — ONDANSETRON HCL 4 MG PO TABS: 4.0000 mg | ORAL_TABLET | ORAL | Status: DC | PRN

## 2018-05-05 MED ORDER — COCONUT OIL OIL: 1.0000 "application " | TOPICAL_OIL | Status: DC | PRN

## 2018-05-05 MED ORDER — DIBUCAINE 1 % RE OINT: 1.0000 "application " | TOPICAL_OINTMENT | RECTAL | Status: DC | PRN

## 2018-05-05 MED ORDER — ACETAMINOPHEN 325 MG PO TABS: 650.0000 mg | ORAL_TABLET | ORAL | Status: DC | PRN

## 2018-05-05 MED ORDER — ZOLPIDEM TARTRATE 5 MG PO TABS: 5.0000 mg | ORAL_TABLET | Freq: Every evening | ORAL | Status: DC | PRN

## 2018-05-05 MED ORDER — PRENATAL MULTIVITAMIN CH
1.0000 | ORAL_TABLET | Freq: Every day | ORAL | Status: DC
  Administered 2018-05-05 – 2018-05-06 (×2): 1 via ORAL
  Filled 2018-05-05 (×2): qty 1

## 2018-05-05 MED ORDER — DIPHENHYDRAMINE HCL 25 MG PO CAPS: 25.0000 mg | ORAL_CAPSULE | Freq: Four times a day (QID) | ORAL | Status: DC | PRN

## 2018-05-05 MED ORDER — ONDANSETRON HCL 4 MG/2ML IJ SOLN: 4.0000 mg | INTRAMUSCULAR | Status: DC | PRN

## 2018-05-05 MED ORDER — SENNOSIDES-DOCUSATE SODIUM 8.6-50 MG PO TABS
2.0000 | ORAL_TABLET | ORAL | Status: DC
  Administered 2018-05-06: 2 via ORAL
  Filled 2018-05-05 (×2): qty 2

## 2018-05-05 MED ORDER — TETANUS-DIPHTH-ACELL PERTUSSIS 5-2.5-18.5 LF-MCG/0.5 IM SUSP: 0.5000 mL | Freq: Once | INTRAMUSCULAR | Status: DC

## 2018-05-05 MED ORDER — BENZOCAINE-MENTHOL 20-0.5 % EX AERO: 1.0000 "application " | INHALATION_SPRAY | CUTANEOUS | Status: DC | PRN

## 2018-05-05 MED ORDER — SIMETHICONE 80 MG PO CHEW: 80.0000 mg | CHEWABLE_TABLET | ORAL | Status: DC | PRN

## 2018-05-05 NOTE — Progress Notes (Signed)
PPD#1 Pt without complaints. Lochia wnl VSSAF IMP/ Stable Plan/ Routine care 

## 2018-05-05 NOTE — Lactation Note (Addendum)
This note was copied from a baby's chart. Lactation Consultation Note: Mother is a P2 , Mother  reports that she breastfed her first child for one year.  Infant is 7914 hours old and has had 6 feedings.   Mother in side lying position when I arrived in the room.  Infant on the left breast with a shallow latch.  Assist mother with flanging infants lips for wider gape. Observed infant with good burst of suckling And a few swallows.  Mother reports that she is able to hand express and that she hears infant swallowing.   Mother advised to cue base feed and feed infant at least 8-12 times in 24 hours.  Discussed cluster feeding. Mother advised to do frequent STS. Mother was given [facility] brochure and reviewed basic breastfeeding.  Informed mother of BFSG , outpatient services and phone line for questions or concerns.  Mother receptive to all teaching.   Patient Name: Christine Payne NFAOZ'HToday's Date: 05/05/2018 Reason for consult: Initial assessment   Maternal Data    Feeding Feeding Type: Breast Fed Length of feed: 15 min  LATCH Score Latch: Grasps breast easily, tongue down, lips flanged, rhythmical sucking.  Audible Swallowing: A few with stimulation  Type of Nipple: Everted at rest and after stimulation  Comfort (Breast/Nipple): Soft / non-tender  Hold (Positioning): Assistance needed to correctly position infant at breast and maintain latch.(assist with flanging infants lips for wider gape. )  LATCH Score: 8  Interventions Interventions: Breast feeding basics reviewed;Skin to skin;Breast compression;Expressed milk  Lactation Tools Discussed/Used     Consult Status Consult Status: Follow-up Date: 05/06/18 Follow-up type: In-patient    Stevan BornKendrick, Kiley Torrence [facility] 05/05/2018, 12:47 PM

## 2018-05-06 LAB — RPR: RPR: NONREACTIVE

## 2018-05-06 MED ORDER — DOCUSATE SODIUM 100 MG PO CAPS: 100.0000 mg | ORAL_CAPSULE | Freq: Two times a day (BID) | ORAL | 0 refills | Status: AC

## 2018-05-06 MED ORDER — IBUPROFEN 600 MG PO TABS: 600.0000 mg | ORAL_TABLET | Freq: Four times a day (QID) | ORAL | 0 refills | Status: AC | PRN

## 2018-05-06 NOTE — Lactation Note (Signed)
This note was copied from a baby's chart. Lactation Consultation Note  Patient Name: Christine Payne PancoastJignasha Markham RUEAV'WToday's Date: 05/06/2018 Reason for consult: Follow-up assessment;Term  P2 mother whose infant is now 1734 hours old.  Mother breastfed her first child for 1 year.  Mother was feeding baby in the side lying position as I arrived.  She had no questions/concerns related to breastfeeding.  Baby had flanged lips and audible swallows noted.  Mother stated there was no pain with latching.  Her breasts are soft and non tender and nipples are erect.  Mother given a hand pump with instructions for use.  #24 flange size was too small so I provided a #27 for more comfort.  Mother knows to use EBM to rub into nipple/areola after feeding.  She will continue to feed 8-12 times/24 hours or more if baby shows cues.  Encouraged to use hand expression before and after feedings.  Engorgement prevention/treatment reviewed.    OP number provided and mother will call for assistance as needed.  Family in room and hoping to be discharged today.   Maternal Data Formula Feeding for Exclusion: No Has patient been taught Hand Expression?: Yes Does the patient have breastfeeding experience prior to this delivery?: Yes  Feeding Feeding Type: Breast Fed Length of feed: 15 min  LATCH Score Latch: Grasps breast easily, tongue down, lips flanged, rhythmical sucking.  Audible Swallowing: Spontaneous and intermittent  Type of Nipple: Everted at rest and after stimulation  Comfort (Breast/Nipple): Soft / non-tender  Hold (Positioning): No assistance needed to correctly position infant at breast.  LATCH Score: 10  Interventions Interventions: Breast feeding basics reviewed;Assisted with latch;Breast massage;Hand express;Hand pump  Lactation Tools Discussed/Used WIC Program: No   Consult Status Consult Status: Complete Date: 05/06/18 Follow-up type: Call as needed    Odilia Damico R Tonette Koehne 05/06/2018, 9:05  AM

## 2018-05-06 NOTE — Plan of Care (Signed)
Pt. Condition will continue to improve 

## 2018-05-06 NOTE — Discharge Summary (Signed)
Obstetric Discharge Summary Reason for Admission: onset of labor Prenatal Procedures: NST and ultrasound Intrapartum Procedures: spontaneous vaginal delivery Postpartum Procedures: none Complications-Operative and Postpartum: none Hemoglobin  Date Value Ref Range Status  05/05/2018 8.4 (L) 12.0 - 15.0 g/dL Final   HCT  Date Value Ref Range Status  05/05/2018 25.2 (L) 36.0 - 46.0 % Final    Physical Exam:  General: alert, cooperative and appears stated age 67Lochia: appropriate Uterine Fundus: firm DVT Evaluation: No evidence of DVT seen on physical exam.  Discharge Diagnoses: Term Pregnancy-delivered  Discharge Information: Date: 05/06/2018 Activity: pelvic rest Diet: routine Medications: Ibuprofen and Colace Condition: improved Instructions: refer to practice specific booklet Discharge to: home   Newborn Data: Live born female  Birth Weight: 5 lb 10.5 oz (2565 g) APGAR: ,   Newborn Delivery   Birth date/time:  05/04/2018 22:33:00 Delivery type:  Vaginal, Spontaneous     Home with mother.  Waynard ReedsKendra Hyden Soley 05/06/2018, 10:48 AM

## 2018-05-18 ENCOUNTER — Encounter (HOSPITAL_COMMUNITY): Payer: Self-pay | Admitting: *Deleted

## 2018-05-20 ENCOUNTER — Emergency Department (HOSPITAL_COMMUNITY)
Admission: EM | Admit: 2018-05-20 | Discharge: 2018-05-20 | Disposition: A | Discharge status: Home / Self Care | Payer: Medicaid Other | Attending: Emergency Medicine | Admitting: Emergency Medicine

## 2018-05-20 ENCOUNTER — Other Ambulatory Visit: Payer: Self-pay

## 2018-05-20 ENCOUNTER — Encounter (HOSPITAL_COMMUNITY): Payer: Self-pay | Admitting: Emergency Medicine

## 2018-05-20 DIAGNOSIS — E039 Hypothyroidism, unspecified: Secondary | ICD-10-CM | POA: Diagnosis not present

## 2018-05-20 DIAGNOSIS — Z79899 Other long term (current) drug therapy: Secondary | ICD-10-CM | POA: Diagnosis not present

## 2018-05-20 DIAGNOSIS — R21 Rash and other nonspecific skin eruption: Secondary | ICD-10-CM | POA: Diagnosis not present

## 2018-05-20 LAB — CBC WITH DIFFERENTIAL/PLATELET
Abs Immature Granulocytes: 0 10*3/uL (ref 0.0–0.1)
Basophils Absolute: 0 10*3/uL (ref 0.0–0.1)
Basophils Relative: 0 %
EOS ABS: 2.1 10*3/uL — AB (ref 0.0–0.7)
EOS PCT: 21 %
HEMATOCRIT: 36.5 % (ref 36.0–46.0)
Hemoglobin: 10.6 g/dL — ABNORMAL LOW (ref 12.0–15.0)
IMMATURE GRANULOCYTES: 0 %
LYMPHS ABS: 2.6 10*3/uL (ref 0.7–4.0)
Lymphocytes Relative: 25 %
MCH: 22.4 pg — AB (ref 26.0–34.0)
MCHC: 29 g/dL — AB (ref 30.0–36.0)
MCV: 77.2 fL — AB (ref 78.0–100.0)
MONO ABS: 0.6 10*3/uL (ref 0.1–1.0)
MONOS PCT: 6 %
Neutro Abs: 4.9 10*3/uL (ref 1.7–7.7)
Neutrophils Relative %: 48 %
Platelets: 388 10*3/uL (ref 150–400)
RBC: 4.73 MIL/uL (ref 3.87–5.11)
RDW: 17.1 % — AB (ref 11.5–15.5)
WBC: 10.3 10*3/uL (ref 4.0–10.5)

## 2018-05-20 LAB — COMPREHENSIVE METABOLIC PANEL
ALK PHOS: 109 U/L (ref 38–126)
ALT: 31 U/L (ref 0–44)
AST: 35 U/L (ref 15–41)
Albumin: 3.7 g/dL (ref 3.5–5.0)
Anion gap: 9 (ref 5–15)
BUN: 9 mg/dL (ref 6–20)
CALCIUM: 9 mg/dL (ref 8.9–10.3)
CHLORIDE: 106 mmol/L (ref 98–111)
CO2: 24 mmol/L (ref 22–32)
CREATININE: 0.75 mg/dL (ref 0.44–1.00)
GFR calc Af Amer: >60 mL/min (ref >60)
GFR calc non Af Amer: >60 mL/min (ref >60)
GLUCOSE: 125 mg/dL — AB (ref 70–99)
Potassium: 3.8 mmol/L (ref 3.5–5.1)
SODIUM: 139 mmol/L (ref 135–145)
Total Bilirubin: 0.2 mg/dL — ABNORMAL LOW (ref 0.3–1.2)
Total Protein: 7.2 g/dL (ref 6.5–8.1)

## 2018-05-20 MED ORDER — HYDROCORTISONE 2.5 % EX LOTN: TOPICAL_LOTION | Freq: Two times a day (BID) | CUTANEOUS | 0 refills | Status: AC

## 2018-05-20 NOTE — Discharge Instructions (Signed)
As we discussed, using oatmeal baths can help with the rash  Oatmeal is gentle on the skin, and is not known to cause skin allergies. However, you may consider using organically grown oats for your bath to reduce the possibility of irritants. Definitely do not use instant oatmeal.  If youre using home ground oatmeal, experiment with how much is right for the water volume of your tub. (The only downside of using too much is that youre wasting oats.)  Its best to start with 1/2 cup (4 ounces) of colloidal oatmeal and work up to as much 1 1/2 cups (12 ounces).  Use hydrocortisone lotion as directed.  As we discussed, the rash continues, you can follow-up with your dermatologist.  Return to emergency department for any worsening rash, fever, lesions mouth or any other worsening concerning symptoms.

## 2018-05-20 NOTE — ED Provider Notes (Signed)
Patient placed in Quick Look pathway, seen and evaluated   Chief Complaint: rash  HPI:   Christine Payne is a 37 y.o. female who presents to the ED with a rash that started 10 days ago and has gotten worse. She called her doctor and they told her to use hydrocortisone cream and take Zyrtec and she did but it did not help.   ROS: Skin: rash  Physical Exam:  BP 137/80 (BP Location: Right Arm)   Pulse 97   Temp 98.4 F (36.9 C) (Oral)   Resp 18   SpO2 100%    Gen: No distress  Neuro: Awake and Alert  Skin: generalized rash    Initiation of care has begun. The patient has been counseled on the process, plan, and necessity for staying for the completion/evaluation, and the remainder of the medical screening examination    Christine Payne, Hope M, NP 05/20/18 2053    Christine Payne, Kevin, MD 05/21/18 [phone]

## 2018-05-20 NOTE — ED Provider Notes (Signed)
[facility] Note   CSN: [id number] Arrival date & time: 05/20/18  2029     History   Chief Complaint Chief Complaint  Patient presents with  . Rash    HPI Christine Payne is a 37 y.o. female who presents for evaluation of rash that is been ongoing for the last 10 days.  Patient reports that initially rash started on her arms and legs and states that spread to her stomach, back.  Patient reports that it is very pruritic.  She called her OB/GYN doctor who prescribed her Zyrtec and hydrocortisone cream which she states she has been using.  Patient reports she is continued to have itching.  Patient states that she has not had any new lotions, detergents, soaps.  She does report that she switch to a different lower to make tortillas with.  She had previously not had any food allergies.  Patient states that nobody else in the house has similar symptoms.  Patient states that she is not having fevers, abdominal pain, nausea/vomiting, or lesions.  Of note, patient recently gave birth on 05/06/2018.  The history is provided by the patient.    Past Medical History:  Diagnosis Date  . Fracture    right leg  . Hypothyroidism     Patient Active Problem List   Diagnosis Date Noted  . AMA (advanced maternal age) multigravida 35+ 05/04/2018    Past Surgical History:  Procedure Laterality Date  . NO PAST SURGERIES       OB History    Gravida  2   Para  2   Term  2   Preterm      AB      Living  2     SAB      TAB      Ectopic      Multiple  0   Live Births  2            Home Medications    Prior to Admission medications   Medication Sig Start Date End Date Taking? Authorizing Provider  docusate sodium (COLACE) 100 MG capsule Take 1 capsule (100 mg total) by mouth 2 (two) times daily. 05/06/18   Waynard Reedsoss, Kendra, MD  hydrocortisone 2.5 % lotion Apply topically 2 (two) times daily. 05/20/18   Maxwell CaulLayden, Landon Bassford A, PA-C    ibuprofen (ADVIL,MOTRIN) 600 MG tablet Take 1 tablet (600 mg total) by mouth every 6 (six) hours as needed. 05/06/18   Waynard Reedsoss, Kendra, MD    Family History Family History  Problem Relation Age of Onset  . Hypertension Mother     Social History Social History   Tobacco Use  . Smoking status: Never Smoker  . Smokeless tobacco: Never Used  Substance Use Topics  . Alcohol use: No    Frequency: Never  . Drug use: No     Allergies   Patient has no known allergies.   Review of Systems Review of Systems  Constitutional: Negative for fever.  Respiratory: Negative for cough and shortness of breath.   Cardiovascular: Negative for chest pain.  Gastrointestinal: Negative for abdominal pain, nausea and vomiting.  Skin: Positive for rash.  All other systems reviewed and are negative.    Physical Exam Updated Vital Signs BP 125/85 (BP Location: Right Arm)   Pulse 90   Temp 98.4 F (36.9 C) (Oral)   Resp 16   SpO2 100%   Physical Exam  Constitutional: She appears well-developed and well-nourished.  HENT:  Head: Normocephalic and atraumatic.  No oral lesions.  Airways patent, phonation is intact.  Eyes: Conjunctivae and EOM are normal. Right eye exhibits no discharge. Left eye exhibits no discharge. No scleral icterus.  Pulmonary/Chest: Effort normal.  Abdominal: Soft. Normal appearance. There is no tenderness.  Musculoskeletal:  Mild edmea noted to BLE. BLE are symmetric in appearance.   Neurological: She is alert.  Skin: Skin is warm and dry. Capillary refill takes less than 2 seconds. Rash noted.  Patient with several scattered, erythematous papules noted to bilateral lower extremities.  Diffuse maculopapular rash noted to abdomen, chest, back, upper arms.  No rash noted on palms or soles of feet. No petechaie.   Psychiatric: She has a normal mood and affect. Her speech is normal and behavior is normal.  Nursing note and vitals reviewed.    ED Treatments / Results   Labs (all labs ordered are listed, but only abnormal results are displayed) Labs Reviewed  COMPREHENSIVE METABOLIC PANEL - Abnormal; Notable for the following components:      Result Value   Glucose, Bld 125 (*)    Total Bilirubin 0.2 (*)    All other components within normal limits  CBC WITH DIFFERENTIAL/PLATELET - Abnormal; Notable for the following components:   Hemoglobin 10.6 (*)    MCV 77.2 (*)    MCH 22.4 (*)    MCHC 29.0 (*)    RDW 17.1 (*)    Eosinophils Absolute 2.1 (*)    All other components within normal limits    EKG None  Radiology No results found.  Procedures Procedures (including critical care time)  Medications Ordered in ED Medications - No data to display   Initial Impression / Assessment and Plan / ED Course  I have reviewed the triage vital signs and the nursing notes.  Pertinent labs & imaging results that were available during my care of the patient were reviewed by me and considered in my medical decision making (see chart for details).     37 y.o. female who presents for evaluation of a rash that began about 10 days ago.  Patient states that she called her OB/GYN who prescribed hydrocortisone cream and Zyrtec which she has been using with no improvement.  She reports no oral lesions.  She reports that there is a headache but not painful.  She does report she started using a different flower but otherwise denies any new exposures.  Patient is not complaining of any abdominal pain, nausea/vomiting, chest pain, difficulty breathing. Patient is afebrile, non-toxic appearing, sitting comfortably on examination table. Vital signs reviewed and stable. Patient with no evidence of petechiae.  On exam, she has diffuse scattered erythematous papules noted to bilateral lower extremities.  Additionally, she has some diffuse maculopapular rash noted to bilateral upper extremities, abdomen, back.  She is currently breast-feeding as she just recently gave birth on  05/06/2018.  No rash noted on palms or soles of feet.  Consider rash of pregnancy versus contact dermatitis versus reaction.  History/physical exam is not concerning for SJS, TENS, syphilis.  Discussed with Dr. Jacqulyn Bath after independent evaluation patient.  She is 2 weeks postpartum.  Patient not complaining of any abdominal pain there is no petechiae noted on exam.  Will check basic labs including CBC, CMP to evaluate for LFTs and platelets.  CBC shows normal platelets.  LFTs are within normal limits.  Total bili is within normal limits.  No acute abnormalities on blood work.  Discussed results with patient.  Given that she is breast-feeding, her options for treatment are limited as most things will be present in breastmilk.  I discussed continuing topical treatment, including switching to hydrocortisone lotion to see if that helps.  Initially, we discussed with patient regarding at home supportive treatments, including oatmeal baths.  Encouraged her to try at home supportive therapies.  Instructed her to follow-up with her primary care doctor in the next few days if symptoms do not improve. Patient had ample opportunity for questions and discussion. All patient's questions were answered with full understanding. Strict return precautions discussed. Patient expresses understanding and agreement to plan.   Final Clinical Impressions(s) / ED Diagnoses   Final diagnoses:  Rash    ED Discharge Orders         Ordered    hydrocortisone 2.5 % lotion  2 times daily     05/20/18 2339           Maxwell Caul, PA-C 05/21/18 0018    Maia Plan, MD 05/21/18 [phone]

## 2018-05-20 NOTE — ED Notes (Signed)
Nurse collecting labs. 

## 2018-05-20 NOTE — ED Notes (Signed)
Patient verbalizes understanding of discharge instructions. Opportunity for questioning and answers were provided. Armband removed by staff, pt discharged from ED, ambulatory to the lobby with family.  

## 2018-05-20 NOTE — ED Triage Notes (Signed)
Pt presents to ED for rash x 1 week, was told to take cortisone cream and zyrtec x 7 days with no relief.  Patient states continues to spread.  Itchy,

## 2018-06-01 DIAGNOSIS — L309 Dermatitis, unspecified: Secondary | ICD-10-CM | POA: Diagnosis not present

## 2020-01-09 ENCOUNTER — Other Ambulatory Visit: Payer: Self-pay

## 2020-01-09 ENCOUNTER — Emergency Department (HOSPITAL_COMMUNITY)
Admission: EM | Admit: 2020-01-09 | Discharge: 2020-01-10 | Disposition: A | Discharge status: Home / Self Care | Payer: Medicaid Other | Attending: Emergency Medicine | Admitting: Emergency Medicine

## 2020-01-09 ENCOUNTER — Encounter (HOSPITAL_COMMUNITY): Payer: Self-pay | Admitting: Emergency Medicine

## 2020-01-09 DIAGNOSIS — L03811 Cellulitis of head [any part, except face]: Secondary | ICD-10-CM

## 2020-01-09 DIAGNOSIS — L03211 Cellulitis of face: Secondary | ICD-10-CM | POA: Diagnosis not present

## 2020-01-09 DIAGNOSIS — E039 Hypothyroidism, unspecified: Secondary | ICD-10-CM | POA: Insufficient documentation

## 2020-01-09 DIAGNOSIS — L539 Erythematous condition, unspecified: Secondary | ICD-10-CM | POA: Diagnosis present

## 2020-01-09 LAB — BASIC METABOLIC PANEL
Anion gap: 7 (ref 5–15)
BUN: 9 mg/dL (ref 6–20)
CO2: 26 mmol/L (ref 22–32)
Calcium: 8.7 mg/dL — ABNORMAL LOW (ref 8.9–10.3)
Chloride: 101 mmol/L (ref 98–111)
Creatinine, Ser: 0.7 mg/dL (ref 0.44–1.00)
GFR calc Af Amer: >60 mL/min (ref >60)
GFR calc non Af Amer: >60 mL/min (ref >60)
Glucose, Bld: 121 mg/dL — ABNORMAL HIGH (ref 70–99)
Potassium: 3.4 mmol/L — ABNORMAL LOW (ref 3.5–5.1)
Sodium: 134 mmol/L — ABNORMAL LOW (ref 135–145)

## 2020-01-09 LAB — CBC WITH DIFFERENTIAL/PLATELET
Abs Immature Granulocytes: 0.01 10*3/uL (ref 0.00–0.07)
Basophils Absolute: 0 10*3/uL (ref 0.0–0.1)
Basophils Relative: 0 %
Eosinophils Absolute: 0.9 10*3/uL — ABNORMAL HIGH (ref 0.0–0.5)
Eosinophils Relative: 11 %
HCT: 36.3 % (ref 36.0–46.0)
Hemoglobin: 11.3 g/dL — ABNORMAL LOW (ref 12.0–15.0)
Immature Granulocytes: 0 %
Lymphocytes Relative: 21 %
Lymphs Abs: 1.8 10*3/uL (ref 0.7–4.0)
MCH: 23.8 pg — ABNORMAL LOW (ref 26.0–34.0)
MCHC: 31.1 g/dL (ref 30.0–36.0)
MCV: 76.6 fL — ABNORMAL LOW (ref 80.0–100.0)
Monocytes Absolute: 0.6 10*3/uL (ref 0.1–1.0)
Monocytes Relative: 7 %
Neutro Abs: 5.2 10*3/uL (ref 1.7–7.7)
Neutrophils Relative %: 61 %
Platelets: 272 10*3/uL (ref 150–400)
RBC: 4.74 MIL/uL (ref 3.87–5.11)
RDW: 13.2 % (ref 11.5–15.5)
WBC: 8.6 10*3/uL (ref 4.0–10.5)
nRBC: 0 % (ref 0.0–0.2)

## 2020-01-09 NOTE — ED Triage Notes (Signed)
Pt states she is has an insect bite on her left ear and now she has a yellowish secretion coming out of it.  Report itchiness all over her body and in and out fever since yesterday.

## 2020-01-10 MED ORDER — DOXYCYCLINE HYCLATE 100 MG PO TABS
100.0000 mg | ORAL_TABLET | Freq: Once | ORAL | Status: AC
  Administered 2020-01-10: 100 mg via ORAL
  Filled 2020-01-10: qty 1

## 2020-01-10 MED ORDER — DOXYCYCLINE HYCLATE 100 MG PO CAPS: 100.0000 mg | ORAL_CAPSULE | Freq: Two times a day (BID) | ORAL | 0 refills | Status: AC

## 2020-01-10 NOTE — ED Provider Notes (Signed)
[facility] EMERGENCY DEPARTMENT Provider Note   CSN: [id number] Arrival date & time: 01/09/20  2002     History Chief Complaint  Patient presents with  . Insect Bite    Christine Payne is a 39 y.o. female.  Patient presents to the emergency department with a chief complaint of left ear swelling.  She states that she was outside yesterday and thinks she may have been bitten by something on her left ear.  She states that she has had some swelling and itching of the earlobe.  Reports that it is draining some clear yellow fluid from the earlobe.  She has not been wearing earrings.  She reports subjective fevers and chills for the past day.  She has taken BC powder.  Otherwise, she only complains of seasonal allergies.  She is not pregnant or breast-feeding.  She denies any other associated symptoms.  No history of diabetes.  The history is provided by the patient. No language interpreter was used.       Past Medical History:  Diagnosis Date  . Fracture    right leg  . Hypothyroidism     Patient Active Problem List   Diagnosis Date Noted  . AMA (advanced maternal age) multigravida 35+ 05/04/2018    Past Surgical History:  Procedure Laterality Date  . NO PAST SURGERIES       OB History    Gravida  2   Para  2   Term  2   Preterm      AB      Living  2     SAB      TAB      Ectopic      Multiple  0   Live Births  2           Family History  Problem Relation Age of Onset  . Hypertension Mother     Social History   Tobacco Use  . Smoking status: Never Smoker  . Smokeless tobacco: Never Used  Substance Use Topics  . Alcohol use: No  . Drug use: No    Home Medications Prior to Admission medications   Medication Sig Start Date End Date Taking? Authorizing Provider  docusate sodium (COLACE) 100 MG capsule Take 1 capsule (100 mg total) by mouth 2 (two) times daily. 05/06/18   Vanessa Kick, MD  hydrocortisone 2.5 % lotion Apply  topically 2 (two) times daily. 05/20/18   Volanda Napoleon, PA-C  ibuprofen (ADVIL,MOTRIN) 600 MG tablet Take 1 tablet (600 mg total) by mouth every 6 (six) hours as needed. 05/06/18   Vanessa Kick, MD    Allergies    Patient has no known allergies.  Review of Systems   Review of Systems  All other systems reviewed and are negative.   Physical Exam Updated Vital Signs BP 120/77 (BP Location: Right Arm)   Pulse (!) 57   Temp 98.7 F (37.1 C) (Oral)   Resp 16   Ht (!) 5.5" (0.14 m)   Wt 79.4 kg   SpO2 100%   BMI 4068.43 kg/m   Physical Exam Vitals and nursing note reviewed.  Constitutional:      General: She is not in acute distress.    Appearance: She is well-developed.  HENT:     Head: Normocephalic and atraumatic.     Ears:     Comments: Left external ear is notable for mild swelling, mostly located around the left earlobe with some mild honey colored  crusty lesions, no discharge or abnormality noted from the left ear canal, TM is intact and normal in appearance  Right ear is normal in appearance Eyes:     Conjunctiva/sclera: Conjunctivae normal.  Cardiovascular:     Rate and Rhythm: Normal rate.     Heart sounds: No murmur.  Pulmonary:     Effort: Pulmonary effort is normal. No respiratory distress.  Abdominal:     General: There is no distension.  Musculoskeletal:     Cervical back: Neck supple.     Comments: Moves all extremities  Skin:    General: Skin is warm and dry.  Neurological:     Mental Status: She is alert and oriented to person, place, and time.  Psychiatric:        Mood and Affect: Mood normal.        Behavior: Behavior normal.       ED Results / Procedures / Treatments   Labs (all labs ordered are listed, but only abnormal results are displayed) Labs Reviewed  CBC WITH DIFFERENTIAL/PLATELET - Abnormal; Notable for the following components:      Result Value   Hemoglobin 11.3 (*)    MCV 76.6 (*)    MCH 23.8 (*)    Eosinophils  Absolute 0.9 (*)    All other components within normal limits  BASIC METABOLIC PANEL - Abnormal; Notable for the following components:   Sodium 134 (*)    Potassium 3.4 (*)    Glucose, Bld 121 (*)    Calcium 8.7 (*)    All other components within normal limits    EKG None  Radiology No results found.  Procedures Procedures (including critical care time)  Medications Ordered in ED Medications  doxycycline (VIBRA-TABS) tablet 100 mg (100 mg Oral Given 01/10/20 0329)    ED Course  I have reviewed the triage vital signs and the nursing notes.  Pertinent labs & imaging results that were available during my care of the patient were reviewed by me and considered in my medical decision making (see chart for details).    MDM Rules/Calculators/A&P                      Patient here with mild swelling and with clear yellow discharge from left earlobe x1 day.  She has had subjective fevers and chills.  She is not diabetic.  Doubt malignant otitis externa.  The ear canal is normal.  There is no debris in the canal.  The left tympanic membrane is normal.  Vital signs are stable.  Laboratory work-up was ordered in triage and does not have any pertinent concerning findings.  There is no significant leukocytosis.  Electrolytes are notable for mild hyponatremia and mild hypokalemia.  Left earlobe is consistent with mild cellulitis.  Will cover with doxycycline.  Return precautions discussed.  Patient understands and agrees with plan. Final Clinical Impression(s) / ED Diagnoses Final diagnoses:  Cellulitis of head except face    Rx / DC Orders ED Discharge Orders         Ordered    doxycycline (VIBRAMYCIN) 100 MG capsule  2 times daily     01/10/20 0336           Roxy Horseman, PA-C 01/10/20 0272    Zadie Rhine, MD 01/10/20 [phone]

## 2020-10-17 ENCOUNTER — Ambulatory Visit: Payer: Medicaid Other | Attending: Internal Medicine

## 2020-10-17 DIAGNOSIS — Z23 Encounter for immunization: Secondary | ICD-10-CM

## 2020-10-17 NOTE — Progress Notes (Signed)
   Covid-19 Vaccination Clinic  Name:  Christine Payne    MRN: [id number] DOB: [birth year]  10/17/2020  [name] was observed post Covid-19 immunization for 15 minutes without incident. She was provided with Vaccine Information Sheet and instruction to access the V-Safe system.   [name] was instructed to call 911 with any severe reactions post vaccine: Marland Kitchen Difficulty breathing  . Swelling of face and throat  . A fast heartbeat  . A bad rash all over body  . Dizziness and weakness   Immunizations Administered    Name Date Dose VIS Date Route   PFIZER Comrnaty(Gray TOP) Covid-19 Vaccine 10/17/2020  4:27 PM 0.3 mL 08/29/2020 Intramuscular   Manufacturer: [employer], [employer]   Lot: [id number]   NDC: [phone]

## 2021-07-24 ENCOUNTER — Emergency Department (HOSPITAL_COMMUNITY)
Admission: EM | Admit: 2021-07-24 | Discharge: 2021-07-25 | Disposition: A | Discharge status: Home / Self Care | Payer: No Typology Code available for payment source | Attending: Emergency Medicine | Admitting: Emergency Medicine

## 2021-07-24 DIAGNOSIS — E039 Hypothyroidism, unspecified: Secondary | ICD-10-CM | POA: Diagnosis not present

## 2021-07-24 DIAGNOSIS — M79604 Pain in right leg: Secondary | ICD-10-CM | POA: Insufficient documentation

## 2021-07-24 DIAGNOSIS — Y990 Civilian activity done for income or pay: Secondary | ICD-10-CM | POA: Diagnosis not present

## 2021-07-24 DIAGNOSIS — W174XXA Fall from dock, initial encounter: Secondary | ICD-10-CM | POA: Diagnosis not present

## 2021-07-24 DIAGNOSIS — M25561 Pain in right knee: Secondary | ICD-10-CM | POA: Insufficient documentation

## 2021-07-25 ENCOUNTER — Emergency Department (HOSPITAL_COMMUNITY): Payer: No Typology Code available for payment source

## 2021-07-25 ENCOUNTER — Encounter (HOSPITAL_COMMUNITY): Payer: Self-pay

## 2021-07-25 MED ORDER — IBUPROFEN 200 MG PO TABS
600.0000 mg | ORAL_TABLET | Freq: Once | ORAL | Status: AC
  Administered 2021-07-25: 600 mg via ORAL
  Filled 2021-07-25: qty 3

## 2021-07-25 NOTE — Discharge Instructions (Signed)
You can take ibuprofen available over-the-counter according to label instructions as needed for pain.

## 2021-07-25 NOTE — ED Provider Notes (Signed)
[facility]-EMERGENCY DEPT Provider Note   CSN: [id number] Arrival date & time: 07/24/21  2341     History Chief Complaint  Patient presents with   Leg Pain    Christine Payne is a 40 y.o. female.  The history is provided by the patient.  Leg Pain Christine Payne is a 41 y.o. female who presents to the Emergency Department complaining of leg pain. She presents the emergency department accompanied by her coworker for evaluation of acute right knee and tibia pain. She was standing at the edge of the loading dock when the platform collapsed she fell to the ground, about 5 feet beneath her. She complains of pain to the right anterior knee as well as pain going through the right leg. No pain to the foot or ankle. No additional injuries. She has no medical problems and takes no medications. No associated back pain.    Past Medical History:  Diagnosis Date   Fracture    right leg   Hypothyroidism     Patient Active Problem List   Diagnosis Date Noted   AMA (advanced maternal age) multigravida 35+ 05/04/2018    Past Surgical History:  Procedure Laterality Date   NO PAST SURGERIES       OB History     Gravida  2   Para  2   Term  2   Preterm      AB      Living  2      SAB      IAB      Ectopic      Multiple  0   Live Births  2           Family History  Problem Relation Age of Onset   Hypertension Mother     Social History   Tobacco Use   Smoking status: Never   Smokeless tobacco: Never  Substance Use Topics   Alcohol use: No   Drug use: No    Home Medications Prior to Admission medications   Medication Sig Start Date End Date Taking? Authorizing Provider  docusate sodium (COLACE) 100 MG capsule Take 1 capsule (100 mg total) by mouth 2 (two) times daily. 05/06/18   Waynard Reeds, MD  doxycycline (VIBRAMYCIN) 100 MG capsule Take 1 capsule (100 mg total) by mouth 2 (two) times daily. 01/10/20   Roxy Horseman, PA-C   hydrocortisone 2.5 % lotion Apply topically 2 (two) times daily. 05/20/18   Maxwell Caul, PA-C  ibuprofen (ADVIL,MOTRIN) 600 MG tablet Take 1 tablet (600 mg total) by mouth every 6 (six) hours as needed. 05/06/18   Waynard Reeds, MD    Allergies    Patient has no known allergies.  Review of Systems   Review of Systems  All other systems reviewed and are negative.  Physical Exam Updated Vital Signs BP 117/78 (BP Location: Left Arm)   Pulse 67   Temp 98.4 F (36.9 C) (Oral)   Resp 16   Ht 5\' 5"  (1.651 m)   Wt 59 kg   LMP 07/21/2021   SpO2 100%   BMI 21.63 kg/m   Physical Exam Vitals and nursing note reviewed.  Constitutional:      Appearance: She is well-developed.  HENT:     Head: Normocephalic and atraumatic.  Cardiovascular:     Rate and Rhythm: Normal rate and regular rhythm.     Heart sounds: No murmur heard. Pulmonary:     Effort: Pulmonary effort is normal.  No respiratory distress.     Breath sounds: Normal breath sounds.  Abdominal:     Palpations: Abdomen is soft.     Tenderness: There is no abdominal tenderness. There is no guarding or rebound.  Musculoskeletal:     Comments: 2+ DP pulses bilaterally. There is tenderness to palpation over the lateral aspect of the right knee with no significant soft tissue swelling. She is able to flex and extend the knee but does have pain with range of motion. There is no significant tenderness to palpation throughout the tibia, ankle calcaneus and foot.  Skin:    General: Skin is warm and dry.  Neurological:     Mental Status: She is alert and oriented to person, place, and time.  Psychiatric:        Behavior: Behavior normal.    ED Results / Procedures / Treatments   Labs (all labs ordered are listed, but only abnormal results are displayed) Labs Reviewed - No data to display  EKG None  Radiology DG Knee 2 Views Right  Result Date: 07/25/2021 CLINICAL DATA:  Fall injury.  Anterior and lateral knee pain.  EXAM: RIGHT KNEE - 1-2 VIEW COMPARISON:  Study of 05/29/2016 FINDINGS: No evidence of fracture, dislocation, or joint effusion. No evidence of arthropathy or other focal bone abnormality. There is no visible focal soft tissue swelling. A small suprapatellar bursal effusion is again shown. IMPRESSION: Suprapatellar bursal effusion, without evidence of fractures. Electronically Signed   By: Almira Bar M.D.   On: 07/25/2021 00:32   DG Tibia/Fibula Right  Result Date: 07/25/2021 CLINICAL DATA:  Injuries sustained in a fall, right lower extremity. EXAM: RIGHT TIBIA AND FIBULA - 2 VIEW COMPARISON:  None. FINDINGS: There is no evidence of fracture or other focal bone lesions. There is mild swelling at the lateral ankle with no other visible focal swelling. IMPRESSION: Mild swelling at the lateral ankle.  No evidence of fractures. Electronically Signed   By: Almira Bar M.D.   On: 07/25/2021 00:33    Procedures Procedures   Medications Ordered in ED Medications  ibuprofen (ADVIL) tablet 600 mg (has no administration in time range)    ED Course  I have reviewed the triage vital signs and the nursing notes.  Pertinent labs & imaging results that were available during my care of the patient were reviewed by me and considered in my medical decision making (see chart for details).    MDM Rules/Calculators/A&P                          patient here for evaluation of acute pain to the right knee and leg following a fall from about 5 feet. No evidence of acute fractures on imaging. Discussed weight-bearing as tolerated and keeping her leg elevated. Discussed return precautions if her symptoms progress.  Final Clinical Impression(s) / ED Diagnoses Final diagnoses:  Acute pain of right knee    Rx / DC Orders ED Discharge Orders     None        Tilden Fossa, MD 07/25/21 0151

## 2021-07-25 NOTE — ED Triage Notes (Signed)
Patient reports she was at work when the loading fell around 2315. Pt  states she fell 5 feet from the loading dock and landed on her right leg and knee. Pt reports pain 8/10 in right left and knee. Did not hit head and no LOC.

## 2021-07-25 NOTE — ED Notes (Signed)
Knee immobilizer applied to right knee and crutches given to patient as well as demonstrated [place].

## 2021-07-28 ENCOUNTER — Telehealth: Payer: Self-pay

## 2021-07-28 NOTE — Telephone Encounter (Signed)
Transition Care Management Unsuccessful Follow-up Telephone Call  Date of discharge and from where:  07/25/2021-[place]  Attempts:  1st Attempt  Reason for unsuccessful TCM follow-up call:  Left voice message

## 2021-07-29 NOTE — Telephone Encounter (Signed)
Transition Care Management Unsuccessful Follow-up Telephone Call  Date of discharge and from where:  07/25/2021 from [facility]  Attempts:  2nd Attempt  Reason for unsuccessful TCM follow-up call:  Unable to leave message

## 2021-07-30 NOTE — Telephone Encounter (Signed)
Transition Care Management Unsuccessful Follow-up Telephone Call  Date of discharge and from where:  07/25/2021-  Attempts:  3rd Attempt  Reason for unsuccessful TCM follow-up call:  Unable to leave message

## 2021-09-25 DIAGNOSIS — S83241A Other tear of medial meniscus, current injury, right knee, initial encounter: Secondary | ICD-10-CM | POA: Diagnosis not present

## 2021-10-07 DIAGNOSIS — M25561 Pain in right knee: Secondary | ICD-10-CM | POA: Diagnosis not present

## 2021-10-10 DIAGNOSIS — M25561 Pain in right knee: Secondary | ICD-10-CM | POA: Diagnosis not present

## 2023-01-27 ENCOUNTER — Telehealth: Payer: Self-pay

## 2023-01-27 NOTE — Telephone Encounter (Signed)
LVM for patient to call back. AS, CMA
